# Patient Record
Sex: Male | Born: 1965 | Race: White | Hispanic: No | Marital: Married | Smoking: Never smoker
Health system: Southern US, Community
[De-identification: ages and names within clinical notes are randomized; demographics above are authoritative.]

## PROBLEM LIST (undated history)

## (undated) DIAGNOSIS — I10 Essential (primary) hypertension: Secondary | ICD-10-CM

## (undated) HISTORY — PX: HERNIA REPAIR: SHX51

---

## 2010-06-23 ENCOUNTER — Emergency Department (HOSPITAL_COMMUNITY): Admission: EM | Admit: 2010-06-23 | Discharge: 2010-06-23 | Payer: Self-pay | Admitting: Emergency Medicine

## 2011-07-27 IMAGING — CT CT HEAD W/O CM
1 of 2 series · 16 of 30 positions shown, 20 images · non-contrast
Comparison: None.

CLINICAL DATA: Fall at work.  Trauma to anterior head.  Loss of
consciousness.  Nausea.  Headache.

CT HEAD WITHOUT CONTRAST
TECHNIQUE: Contiguous axial images were obtained from the base of
the skull through the vertex without contrast.

[Series 3: recon 2: brain · axial · 0.47mm/px · z∈[+165,+309]mm · 16 of 64 slices shown, 20 images]
[im 4/64  brain]
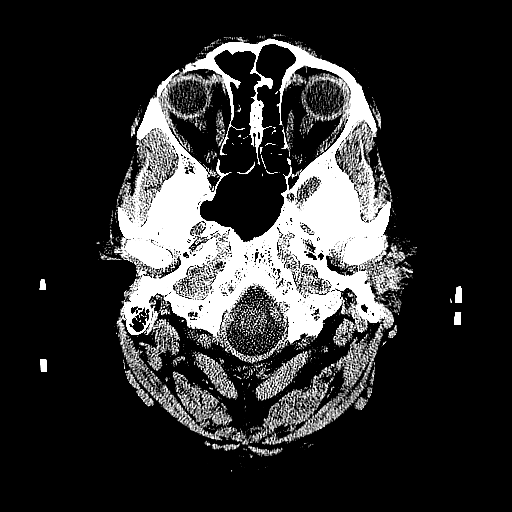
[im 4/64  bone]
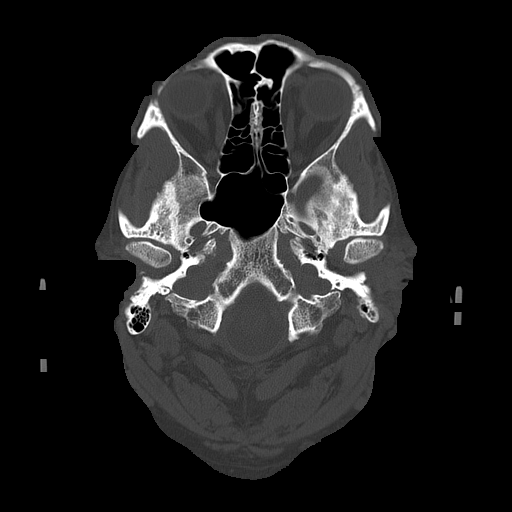
[im 7/64  brain]
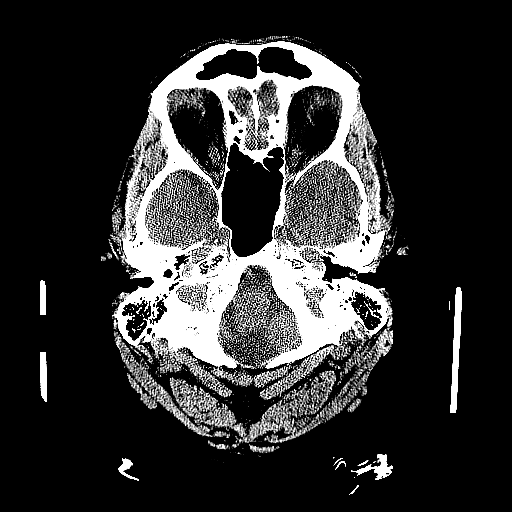
[im 10/64  brain]
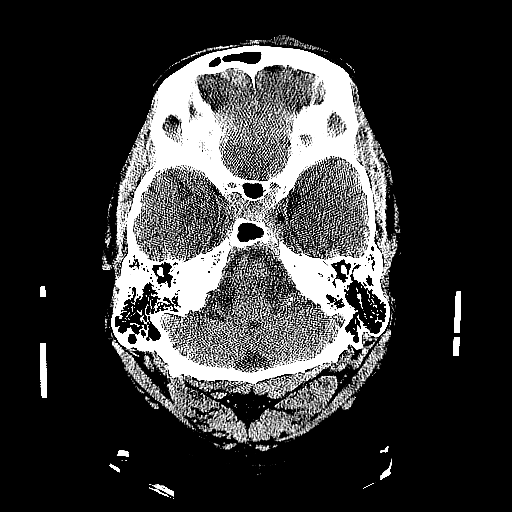
[im 14/64  brain]
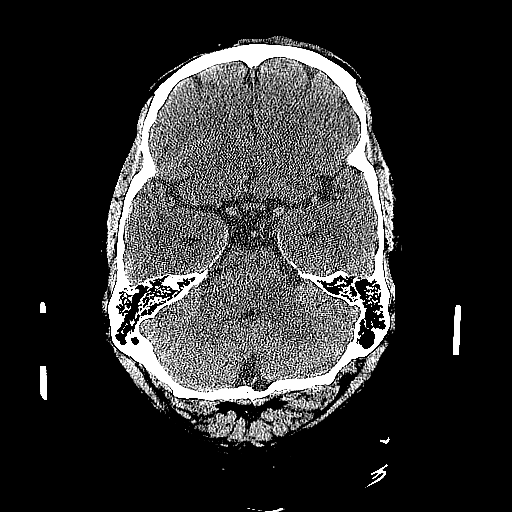
[im 20/64  brain]
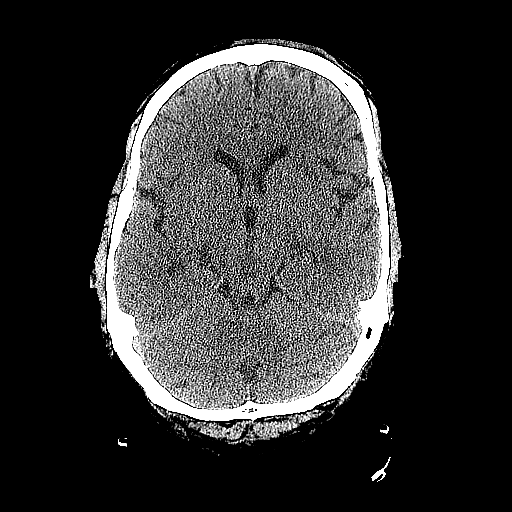
[im 20/64  bone]
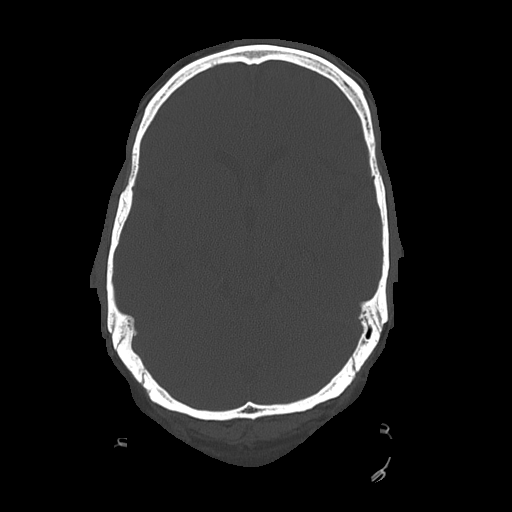
[im 24/64  brain]
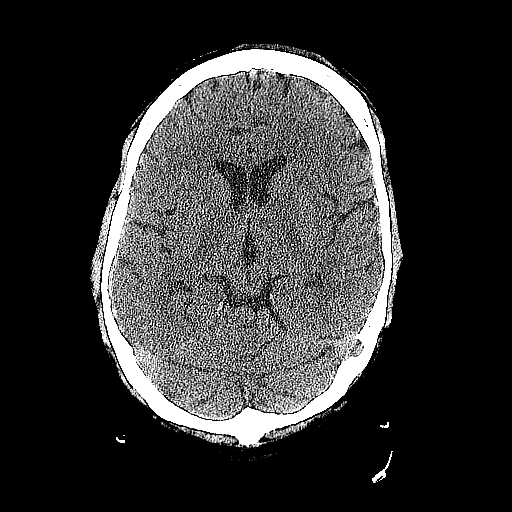
[im 27/64  brain]
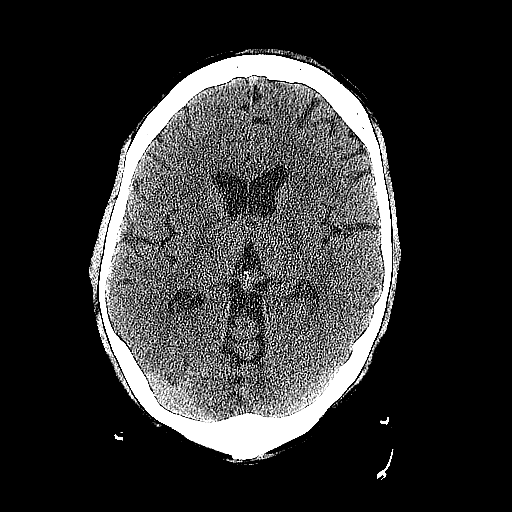
[im 30/64  brain]
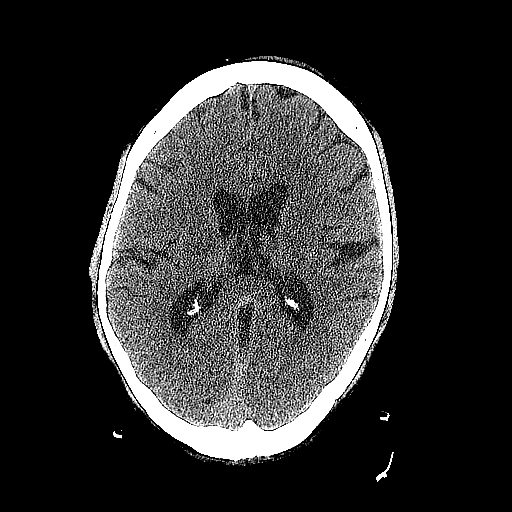
[im 34/64  brain]
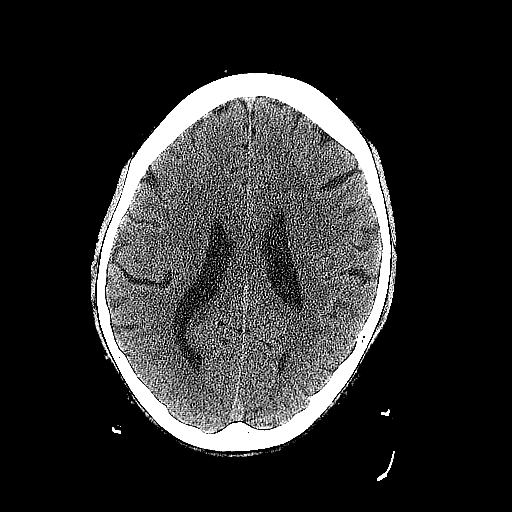
[im 34/64  bone]
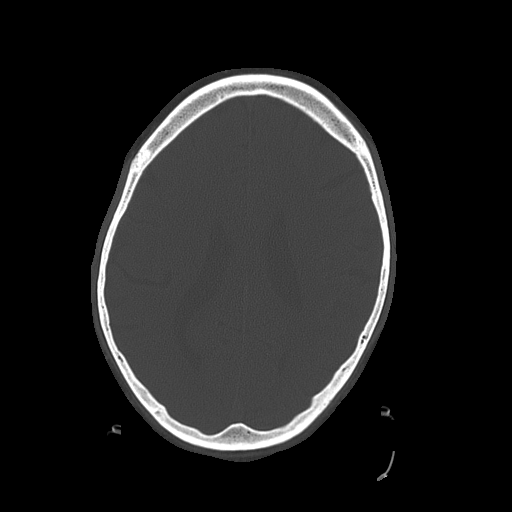
[im 37/64  brain]
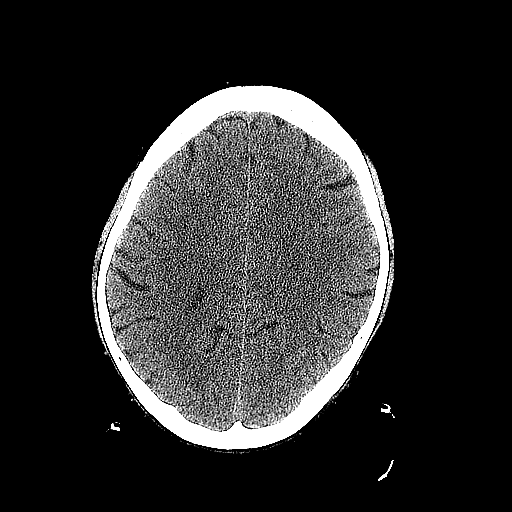
[im 40/64  brain]
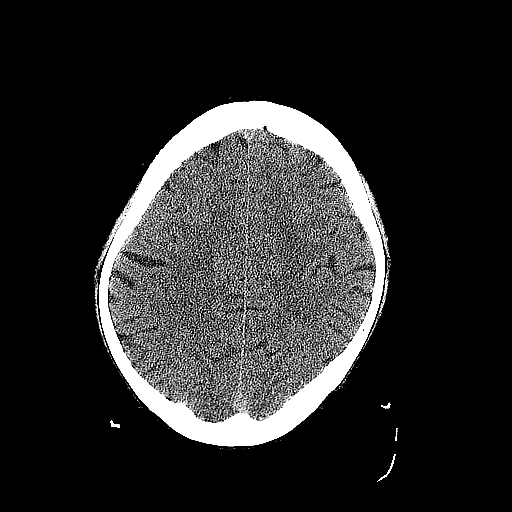
[im 44/64  brain]
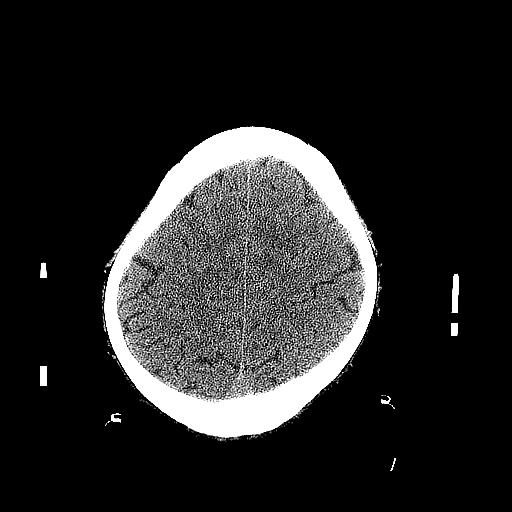
[im 50/64  brain]
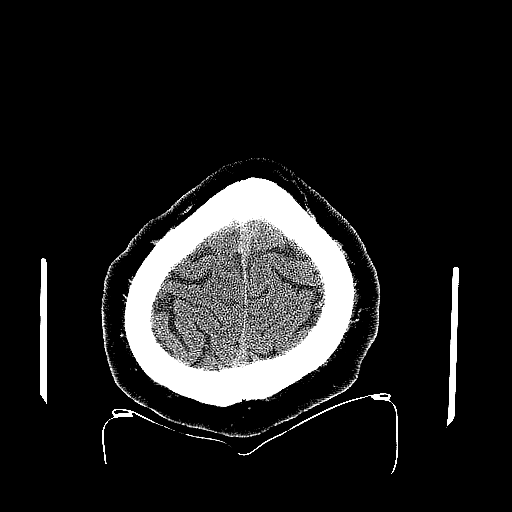
[im 50/64  bone]
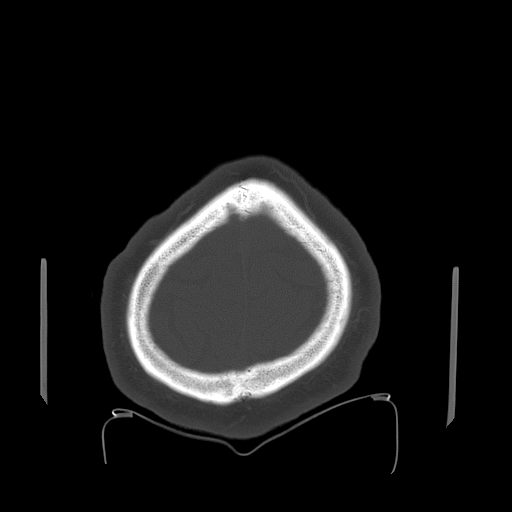
[im 54/64  brain]
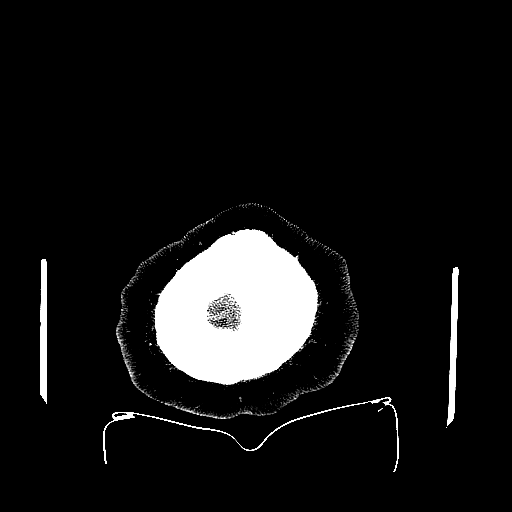
[im 57/64  brain]
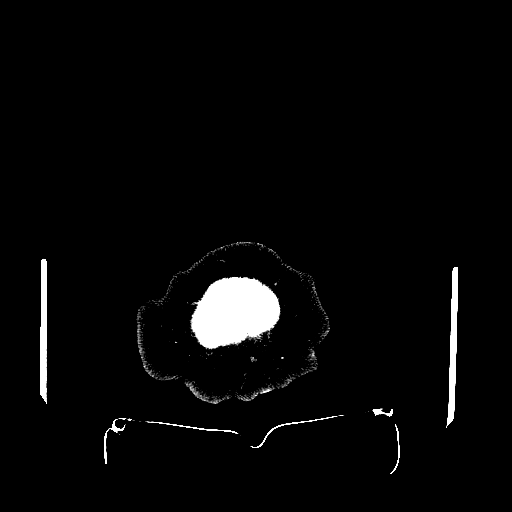
[im 60/64  brain]
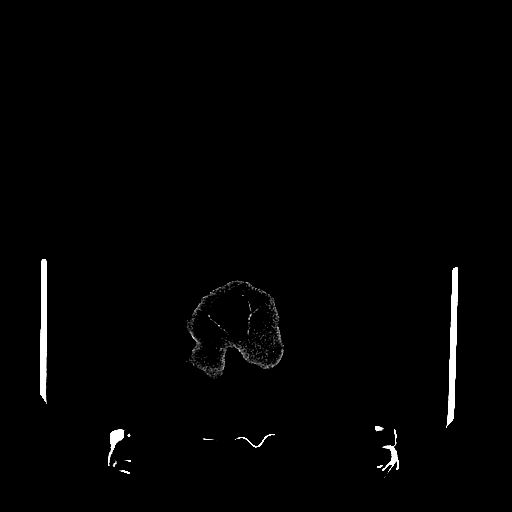

[16 of 30 positions shown; findings below may reference images not displayed]

FINDINGS: Bone windows demonstrate moderate left supraorbital and
frontal scalp soft tissue swelling.  No underlying skull fracture.
Clear paranasal sinuses and mastoid air cells.

Soft tissue windows demonstrate mildly age advanced cerebral
atrophy. No  mass lesion, hemorrhage, hydrocephalus, acute infarct,
intra-axial, or extra-axial fluid collection.
IMPRESSION: 1.  Left supraorbital/frontal scalp soft tissue swelling.
2. No acute intracranial abnormality.
3.  Mildly age advanced cerebral atrophy.

## 2018-08-13 DIAGNOSIS — I10 Essential (primary) hypertension: Secondary | ICD-10-CM | POA: Diagnosis not present

## 2018-08-13 DIAGNOSIS — R51 Headache: Secondary | ICD-10-CM | POA: Diagnosis not present

## 2018-08-13 DIAGNOSIS — J0100 Acute maxillary sinusitis, unspecified: Secondary | ICD-10-CM | POA: Diagnosis not present

## 2018-12-01 DIAGNOSIS — M5430 Sciatica, unspecified side: Secondary | ICD-10-CM | POA: Diagnosis not present

## 2018-12-09 DIAGNOSIS — Z125 Encounter for screening for malignant neoplasm of prostate: Secondary | ICD-10-CM | POA: Diagnosis not present

## 2018-12-09 DIAGNOSIS — J302 Other seasonal allergic rhinitis: Secondary | ICD-10-CM | POA: Diagnosis not present

## 2018-12-09 DIAGNOSIS — M5432 Sciatica, left side: Secondary | ICD-10-CM | POA: Diagnosis not present

## 2018-12-09 DIAGNOSIS — R5383 Other fatigue: Secondary | ICD-10-CM | POA: Diagnosis not present

## 2018-12-09 DIAGNOSIS — I10 Essential (primary) hypertension: Secondary | ICD-10-CM | POA: Diagnosis not present

## 2019-02-24 ENCOUNTER — Emergency Department (HOSPITAL_COMMUNITY): Payer: 59

## 2019-02-24 ENCOUNTER — Emergency Department (HOSPITAL_COMMUNITY)
Admission: EM | Admit: 2019-02-24 | Discharge: 2019-02-24 | Disposition: A | Discharge status: Home / Self Care | Payer: 59 | Attending: Emergency Medicine | Admitting: Emergency Medicine

## 2019-02-24 ENCOUNTER — Encounter (HOSPITAL_COMMUNITY): Payer: Self-pay | Admitting: Pharmacy Technician

## 2019-02-24 ENCOUNTER — Other Ambulatory Visit: Payer: Self-pay

## 2019-02-24 DIAGNOSIS — E669 Obesity, unspecified: Secondary | ICD-10-CM | POA: Diagnosis not present

## 2019-02-24 DIAGNOSIS — R079 Chest pain, unspecified: Secondary | ICD-10-CM

## 2019-02-24 DIAGNOSIS — I10 Essential (primary) hypertension: Secondary | ICD-10-CM

## 2019-02-24 DIAGNOSIS — R0789 Other chest pain: Secondary | ICD-10-CM | POA: Diagnosis present

## 2019-02-24 HISTORY — DX: Essential (primary) hypertension: I10

## 2019-02-24 LAB — BASIC METABOLIC PANEL
Anion gap: 10 (ref 5–15)
BUN: 17 mg/dL (ref 6–20)
CO2: 21 mmol/L — ABNORMAL LOW (ref 22–32)
Calcium: 9.1 mg/dL (ref 8.9–10.3)
Chloride: 106 mmol/L (ref 98–111)
Creatinine, Ser: 0.99 mg/dL (ref 0.61–1.24)
GFR calc Af Amer: >60 mL/min (ref >60)
GFR calc non Af Amer: >60 mL/min (ref >60)
Glucose, Bld: 102 mg/dL — ABNORMAL HIGH (ref 70–99)
Potassium: 4.2 mmol/L (ref 3.5–5.1)
Sodium: 137 mmol/L (ref 135–145)

## 2019-02-24 LAB — CBC
HCT: 45.8 % (ref 39.0–52.0)
Hemoglobin: 15.4 g/dL (ref 13.0–17.0)
MCH: 31.8 pg (ref 26.0–34.0)
MCHC: 33.6 g/dL (ref 30.0–36.0)
MCV: 94.4 fL (ref 80.0–100.0)
Platelets: 243 10*3/uL (ref 150–400)
RBC: 4.85 MIL/uL (ref 4.22–5.81)
RDW: 12.8 % (ref 11.5–15.5)
WBC: 6.3 10*3/uL (ref 4.0–10.5)
nRBC: 0 % (ref 0.0–0.2)

## 2019-02-24 LAB — TROPONIN I (HIGH SENSITIVITY)
Troponin I (High Sensitivity): <2 ng/L (ref <18)
Troponin I (High Sensitivity): 3 ng/L (ref <18)

## 2019-02-24 NOTE — ED Triage Notes (Signed)
Pt arrives via ems with chest tightness onset today. Went to MD office who was concerned with his ekg. Pt given 324mg  asa and 3 SL nitro with complete resolution. Pt with hx of hypertension. VSS with EMS.

## 2019-02-24 NOTE — ED Notes (Signed)
Patient Alert and oriented to baseline. Stable and ambulatory to baseline. Patient verbalized understanding of the discharge instructions.  Patient belongings were taken by the patient.   

## 2019-02-24 NOTE — ED Provider Notes (Signed)
[facility] Provider Note   CSN: [id number] Arrival date & time: 02/24/19  1104   History   Chief Complaint Chief Complaint  Patient presents with  . Chest Pain    HPI Dale Carpenter is a 53 y.o. male who presents with chest pain. PMH significant for asthma, HTN. He states that his BP has been elevated over the past week. It's been running ~160/100s. He saw his PCP and his Lisinopril dose was increased, a diuretic was added, and clonidine was added to take as needed for severely elevated blood pressure.  He states over the past 2 weeks he has had several episodes of chest tightness across his upper chest with associated shortness of breath.  Seem to be worse with exertion and better with rest.  He also had several episodes of lightheadedness.  He went back to his doctor today and they did an EKG and she was concerned with what she saw and therefore decided to have him come to the emergency department.  He was given sublingual nitroglycerin and baby aspirin by EMS and states that his chest tightness is resolved. He denies hx of CAD. He is a former smoker. He has had stress testing ~10 years ago which was normal but no cath.  [facility]  [facility]PI  Past Medical History:  Diagnosis Date  . Hypertension     There are no active problems to display for this patient.   The histories are not reviewed yet. Please review them in the "History" navigator section and refresh this SmartLink.     Home Medications    Prior to Admission medications   Not on File    Family History No family history on file.  Social History Social History   Tobacco Use  . Smoking status: Not on file  Substance Use Topics  . Alcohol use: Not on file  . Drug use: Not on file     Allergies   Patient has no known allergies.   Review of Systems Review of Systems  Constitutional: Negative for diaphoresis and fever.  Respiratory: Positive  for chest tightness and shortness of breath.   Cardiovascular: Negative for chest pain, palpitations and leg swelling.  Gastrointestinal: Negative for abdominal pain, nausea and vomiting.  Neurological: Positive for light-headedness. Negative for syncope.  All other systems reviewed and are negative.    Physical Exam Updated Vital Signs BP 124/86   Pulse 63   Temp 98.5 F (36.9 C) (Oral)   Resp 12   SpO2 98%   Physical Exam Vitals signs and nursing note reviewed.  Constitutional:      General: He is not in acute distress.    Appearance: He is well-developed. He is obese. He is not ill-appearing.  HENT:     Head: Normocephalic and atraumatic.  Eyes:     General: No scleral icterus.       Right eye: No discharge.        Left eye: No discharge.     Conjunctiva/sclera: Conjunctivae normal.     Pupils: Pupils are equal, round, and reactive to light.  Neck:     Musculoskeletal: Normal range of motion.  Cardiovascular:     Rate and Rhythm: Normal rate and regular rhythm.  Pulmonary:     Effort: Pulmonary effort is normal. No respiratory distress.     Breath sounds: Normal breath sounds.  Abdominal:     General: There is no distension.  Palpations: Abdomen is soft.     Tenderness: There is no abdominal tenderness.  Musculoskeletal:     Right lower leg: No edema.     Left lower leg: No edema.  Skin:    General: Skin is warm and dry.  Neurological:     Mental Status: He is alert and oriented to person, place, and time.  Psychiatric:        Behavior: Behavior normal.      ED Treatments / Results  Labs (all labs ordered are listed, but only abnormal results are displayed) Labs Reviewed  BASIC METABOLIC PANEL - Abnormal; Notable for the following components:      Result Value   CO2 21 (*)    Glucose, Bld 102 (*)    All other components within normal limits  CBC  TROPONIN I (HIGH SENSITIVITY)  TROPONIN I (HIGH SENSITIVITY)    EKG EKG Interpretation   Date/Time:  Monday February 24 2019 11:07:44 EDT Ventricular Rate:  82 PR Interval:    QRS Duration: 93 QT Interval:  358 QTC Calculation: 419 R Axis:   24 Text Interpretation:  Sinus rhythm Probable anteroseptal infarct, old No old tracing to compare Confirmed by Dale Carpenter [phone]) on 02/24/2019 12:45:59 PM   Radiology Dg Chest 2 View  Result Date: 02/24/2019 CLINICAL DATA:  Chest tightness, shortness of breath.  Hypertension EXAM: CHEST - 2 VIEW COMPARISON:  03/20/2011 FINDINGS: The heart size and mediastinal contours are within normal limits. Both lungs are clear. The visualized skeletal structures are unremarkable. IMPRESSION: No active cardiopulmonary disease. Electronically Signed   By: Dale Carpenter M.D.   On: 02/24/2019 12:31    Procedures Procedures (including critical care time)  Medications Ordered in ED Medications - No data to display   Initial Impression / Assessment and Plan / ED Course  I have reviewed the triage vital signs and the nursing notes.  Pertinent labs & imaging results that were available during my care of the patient were reviewed by me and considered in my medical decision making (see chart for details).  53 year old male presents with chest tightness which has been ongoing for several weeks along with elevated BP. There are some concerning features in that it's sometimes worse with exertion and it improved with nitro/ASA. Chest pain work up today is reassuring. Doubt ACS, PE, pericarditis, esophageal rupture, tension pneumothorax, aortic dissection, cardiac tamponade. EKG is SR with low voltage and possible T wave inversion in lead III. CXR is negative. Initial and second troponin are <2 and 3 respectively. Labs are unremarkable. No significant past or family hx of cardiac disease. HEART score is 3. He has seen cards in [place] about 10 years ago but would like referral to a cardiologist locally. He likely needs stress test vs elective cath. Advised return  for worsening symptoms.   Final Clinical Impressions(s) / ED Diagnoses   Final diagnoses:  Nonspecific chest pain  Hypertension, unspecified type    ED Discharge Orders    None       Recardo Evangelist, PA-C 02/24/19 [place], [place], DO 02/24/19 1839

## 2019-02-24 NOTE — Discharge Instructions (Signed)
Please continue your blood pressure medicines as prescribed

## 2019-02-24 NOTE — ED Notes (Signed)
Patient transported to X-ray 

## 2019-03-10 ENCOUNTER — Encounter: Payer: Self-pay | Admitting: Cardiology

## 2019-03-10 ENCOUNTER — Telehealth (HOSPITAL_COMMUNITY): Payer: Self-pay | Admitting: *Deleted

## 2019-03-10 ENCOUNTER — Other Ambulatory Visit: Payer: Self-pay

## 2019-03-10 ENCOUNTER — Ambulatory Visit (INDEPENDENT_AMBULATORY_CARE_PROVIDER_SITE_OTHER): Payer: 59 | Admitting: Cardiology

## 2019-03-10 DIAGNOSIS — R0600 Dyspnea, unspecified: Secondary | ICD-10-CM

## 2019-03-10 DIAGNOSIS — R0609 Other forms of dyspnea: Secondary | ICD-10-CM

## 2019-03-10 DIAGNOSIS — E663 Overweight: Secondary | ICD-10-CM

## 2019-03-10 DIAGNOSIS — I10 Essential (primary) hypertension: Secondary | ICD-10-CM

## 2019-03-10 DIAGNOSIS — R079 Chest pain, unspecified: Secondary | ICD-10-CM

## 2019-03-10 DIAGNOSIS — R0789 Other chest pain: Secondary | ICD-10-CM

## 2019-03-10 HISTORY — DX: Essential (primary) hypertension: I10

## 2019-03-10 HISTORY — DX: Other forms of dyspnea: R06.09

## 2019-03-10 HISTORY — DX: Overweight: E66.3

## 2019-03-10 HISTORY — DX: Other chest pain: R07.89

## 2019-03-10 HISTORY — DX: Dyspnea, unspecified: R06.00

## 2019-03-10 NOTE — Patient Instructions (Signed)
Medication Instructions:  Your physician recommends that you continue on your current medications as directed. Please refer to the Current Medication list given to you today.  If you need a refill on your cardiac medications before your next appointment, please call your pharmacy.   Lab work: D-dimer If you have labs (blood work) drawn today and your tests are completely normal, you will receive your results only by: Marland Kitchen. MyChart Message (if you have MyChart) OR . A paper copy in the mail If you have any lab test that is abnormal or we need to change your treatment, we will call you to review the results.  Testing/Procedures: Lexiscan  Follow-Up: At [facility], you and your health needs are our priority.  As part of our continuing mission to provide you with exceptional heart care, we have created designated Provider Care Teams.  These Care Teams include your primary Cardiologist (physician) and Advanced Practice Providers (APPs -  Physician Assistants and Nurse Practitioners) who all work together to provide you with the care you need, when you need it. You will need a follow up appointment in 3 weeks.   Cardiac Nuclear Scan A cardiac nuclear scan is a test that measures blood flow to the heart when a person is resting and when he or she is exercising. The test looks for problems such as:  Not enough blood reaching a portion of the heart.  The heart muscle not working normally. You may need this test if:  You have heart disease.  You have had abnormal lab results.  You have had heart surgery or a balloon procedure to open up blocked arteries (angioplasty).  You have chest pain.  You have shortness of breath. In this test, a radioactive dye (tracer) is injected into your bloodstream. After the tracer has traveled to your heart, an imaging device is used to measure how much of the tracer is absorbed by or distributed to various areas of your heart. This procedure is usually done at  a hospital and takes 2-4 hours. Tell a health care provider about:  Any allergies you have.  All medicines you are taking, including vitamins, herbs, eye drops, creams, and over-the-counter medicines.  Any problems you or family members have had with anesthetic medicines.  Any blood disorders you have.  Any surgeries you have had.  Any medical conditions you have.  Whether you are pregnant or may be pregnant. What are the risks? Generally, this is a safe procedure. However, problems may occur, including:  Serious chest pain and heart attack. This is only a risk if the stress portion of the test is done.  Rapid heartbeat.  Sensation of warmth in your chest. This usually passes quickly.  Allergic reaction to the tracer. What happens before the procedure?  Ask your health care provider about changing or stopping your regular medicines. This is especially important if you are taking diabetes medicines or blood thinners.  Follow instructions from your health care provider about eating or drinking restrictions.  Remove your jewelry on the day of the procedure. What happens during the procedure?  An IV will be inserted into one of your veins.  Your health care provider will inject a small amount of radioactive tracer through the IV.  You will wait for 20-40 minutes while the tracer travels through your bloodstream.  Your heart activity will be monitored with an electrocardiogram (ECG).  You will lie down on an exam table.  Images of your heart will be taken for about  15-20 minutes.  You may also have a stress test. For this test, one of the following may be done: ? You will exercise on a treadmill or stationary bike. While you exercise, your heart's activity will be monitored with an ECG, and your blood pressure will be checked. ? You will be given medicines that will increase blood flow to parts of your heart. This is done if you are unable to exercise.  When blood flow  to your heart has peaked, a tracer will again be injected through the IV.  After 20-40 minutes, you will get back on the exam table and have more images taken of your heart.  Depending on the type of tracer used, scans may need to be repeated 3-4 hours later.  Your IV line will be removed when the procedure is over. The procedure may vary among health care providers and hospitals. What happens after the procedure?  Unless your health care provider tells you otherwise, you may return to your normal schedule, including diet, activities, and medicines.  Unless your health care provider tells you otherwise, you may increase your fluid intake. This will help to flush the contrast dye from your body. Drink enough fluid to keep your urine pale yellow.  Ask your health care provider, or the department that is doing the test: ? When will my results be ready? ? How will I get my results? Summary  A cardiac nuclear scan measures the blood flow to the heart when a person is resting and when he or she is exercising.  Tell your health care provider if you are pregnant.  Before the procedure, ask your health care provider about changing or stopping your regular medicines. This is especially important if you are taking diabetes medicines or blood thinners.  After the procedure, unless your health care provider tells you otherwise, increase your fluid intake. This will help flush the contrast dye from your body.  After the procedure, unless your health care provider tells you otherwise, you may return to your normal schedule, including diet, activities, and medicines. This information is not intended to replace advice given to you by your health care provider. Make sure you discuss any questions you have with your health care provider. Document Released: 08/18/2004 Document Revised: 01/07/2018 Document Reviewed: 01/07/2018 Elsevier Patient Education  [place]. Other Special Instructions Will  Be Listed Below (If Applicable). NONE

## 2019-03-10 NOTE — Addendum Note (Signed)
Addended by: Polly Cobia A on: 03/10/2019 10:28 AM   Modules accepted: Orders

## 2019-03-10 NOTE — Telephone Encounter (Signed)
Patient given detailed instructions per Myocardial Perfusion Study Information Sheet for the test on 03/12/19 at 1:00. Patient notified to arrive 15 minutes early and that it is imperative to arrive on time for appointment to keep from having the test rescheduled.  If you need to cancel or reschedule your appointment, please call the office within 24 hours of your appointment. Patient aware of location change from [facility] to [place]. Patient verbalized understanding.Dale Carpenter

## 2019-03-10 NOTE — Telephone Encounter (Signed)
Called patient to go over instructions and location change to [place].

## 2019-03-10 NOTE — Progress Notes (Signed)
Cardiology Office Note:    Date:  03/10/2019   ID:  Dale Carpenter, DOB 17-Dec-1965, MRN [id number]  PCP:  [facility] [place]  Cardiologist:  Jenean Lindau, MD   Referring MD: [facility]:    1. Chest discomfort   2. Essential hypertension   3. Overweight   4. Dyspnea on exertion    PLAN:    In order of problems listed above:  1. Chest discomfort and dyspnea on exertion: I reviewed emergency room records extensively.  I reviewed EKG also.  This was unremarkable.  In view of the same I will do a d-dimer today to rule out any possibility of pulmonary thromboembolism.  Because of risk factors I will also do a Lexiscan sestamibi.  Patient knows to go to the nearest emergency room for any significant concerns.  He also needs to take enteric-coated baby aspirin on a daily basis for the next few days till his tests are done.  He does have nitroglycerin prescription and knows how to use it. 2. Essential hypertension: Diet was discussed.  Weight reduction was also stressed.  Salt intake issues were discussed diet was discussed for obesity and risks of obesity explained 3. Patient will be seen in follow-up appointment in 3 weeks or earlier if the patient has any concerns    Medication Adjustments/Labs and Tests Ordered: Current medicines are reviewed at length with the patient today.  Concerns regarding medicines are outlined above.  No orders of the defined types were placed in this encounter.  No orders of the defined types were placed in this encounter.    History of Present Illness:    Dale Carpenter is a 53 y.o. male who is being seen today for the evaluation of chest discomfort and dyspnea on exertion at the request of [facility] [place]*.  Patient is a pleasant 53 year old male.  He has past medical history of essential hypertension.  He mentions to me that he has some chest tightness in the upper part of the chest.  No  radiation no orthopnea or PND.  He works in a very hot environment.  He went to the emergency room for the same reason.  At the time of my evaluation, the patient is alert awake oriented and in no distress.  Past Medical History:  Diagnosis Date  . Hypertension      Current Medications: Current Meds  Medication Sig  . cloNIDine (CATAPRES) 0.1 MG tablet Take 0.1 mg by mouth as needed. Pressure over 160/100  . escitalopram (LEXAPRO) 5 MG tablet TK 1 T PO D  . fluticasone (FLONASE) 50 MCG/ACT nasal spray Place 2 sprays into both nostrils daily.  Marland Kitchen lisinopril-hydrochlorothiazide (ZESTORETIC) 20-25 MG tablet Take 1 tablet by mouth daily.  . nitroGLYCERIN (NITROSTAT) 0.4 MG SL tablet DISSOLVE 1 T UNDER TONGUE ONCE UTD FOR CHEST PAIN. MAY REPEAT AFTER 5 MINUTES IF [facility]  . VENTOLIN HFA 108 (90 Base) MCG/ACT inhaler Inhale 2 puffs into the lungs as needed.     Allergies:   Patient has no known allergies.   Social History   Socioeconomic History  . Marital status: Married    Spouse name: Not on file  . Number of children: Not on file  . Years of education: Not on file  . Highest education level: Not on file  Occupational History  . Not on file  Social Needs  . Financial resource strain: Not on  file  . Food insecurity    Worry: Not on file    Inability: Not on file  . Transportation needs    Medical: Not on file    Non-medical: Not on file  Tobacco Use  . Smoking status: Never Smoker  . Smokeless tobacco: Current User    Types: Chew  Substance and Sexual Activity  . Alcohol use: Not on file  . Drug use: Not on file  . Sexual activity: Not on file  Lifestyle  . Physical activity    Days per week: Not on file    Minutes per session: Not on file  . Stress: Not on file  Relationships  . Social Musicianconnections    Talks on phone: Not on file    Gets together: Not on file    Attends religious service: Not on file    Active member of club or  organization: Not on file    Attends meetings of clubs or organizations: Not on file    Relationship status: Not on file  Other Topics Concern  . Not on file  Social History Narrative  . Not on file     Family History: The patient's family history is not on file.  ROS:   Please see the history of present illness.    All other systems reviewed and are negative.  EKGs/Labs/Other Studies Reviewed:    The following studies were reviewed today: EKG reveals sinus rhythm and nonspecific ST-T changes   Recent Labs: 02/24/2019: BUN 17; Creatinine, Ser 0.99; Hemoglobin 15.4; Platelets 243; Potassium 4.2; Sodium 137  Recent Lipid Panel No results found for: CHOL, TRIG, HDL, CHOLHDL, VLDL, LDLCALC, LDLDIRECT  Physical Exam:    VS:  BP 132/86   Pulse 71   Ht 5\' 7"  (1.702 m)   Wt 254 lb (115.2 kg)   SpO2 100%   BMI 39.78 kg/m     Wt Readings from Last 3 Encounters:  03/10/19 254 lb (115.2 kg)     GEN: Patient is in no acute distress HEENT: Normal NECK: No JVD; No carotid bruits LYMPHATICS: No lymphadenopathy CARDIAC: S1 S2 regular, 2/6 systolic murmur at the apex. RESPIRATORY:  Clear to auscultation without rales, wheezing or rhonchi  ABDOMEN: Soft, non-tender, non-distended MUSCULOSKELETAL:  No edema; No deformity  SKIN: Warm and dry NEUROLOGIC:  Alert and oriented x 3 PSYCHIATRIC:  Normal affect    Signed, Garwin Brothersajan R Chelby Salata, MD  03/10/2019 10:16 AM    [facility]

## 2019-03-11 ENCOUNTER — Telehealth (HOSPITAL_COMMUNITY): Payer: Self-pay | Admitting: *Deleted

## 2019-03-11 LAB — D-DIMER, QUANTITATIVE: D-DIMER: 0.2 mg/L FEU (ref 0.00–0.49)

## 2019-03-11 NOTE — Telephone Encounter (Signed)
Close encounter 

## 2019-03-12 ENCOUNTER — Other Ambulatory Visit: Payer: Self-pay

## 2019-03-12 ENCOUNTER — Ambulatory Visit (HOSPITAL_COMMUNITY)
Admission: RE | Admit: 2019-03-12 | Discharge: 2019-03-12 | Disposition: A | Discharge status: Home / Self Care | Payer: 59 | Source: Ambulatory Visit | Attending: Cardiology | Admitting: Cardiology

## 2019-03-12 DIAGNOSIS — R079 Chest pain, unspecified: Secondary | ICD-10-CM | POA: Insufficient documentation

## 2019-03-12 LAB — MYOCARDIAL PERFUSION IMAGING
LV dias vol: 111 mL (ref 62–150)
LV sys vol: 57 mL
Peak HR: 90 {beats}/min
Rest HR: 63 {beats}/min
SDS: 0
SRS: 0
SSS: 0
TID: 1.3

## 2019-03-12 MED ORDER — REGADENOSON 0.4 MG/5ML IV SOLN
0.4000 mg | Freq: Once | INTRAVENOUS | Status: AC
  Administered 2019-03-12: 0.4 mg via INTRAVENOUS

## 2019-03-12 MED ORDER — TECHNETIUM TC 99M TETROFOSMIN IV KIT
10.4000 | PACK | Freq: Once | INTRAVENOUS | Status: AC | PRN
  Administered 2019-03-12: 13:00:00 10.4 via INTRAVENOUS
  Filled 2019-03-12: qty 11

## 2019-03-12 MED ORDER — TECHNETIUM TC 99M TETROFOSMIN IV KIT
32.1000 | PACK | Freq: Once | INTRAVENOUS | Status: AC | PRN
  Administered 2019-03-12: 14:00:00 32.1 via INTRAVENOUS
  Filled 2019-03-12: qty 33

## 2019-03-13 ENCOUNTER — Telehealth: Payer: Self-pay

## 2019-03-13 NOTE — Telephone Encounter (Signed)
Information relayed to patient and his spouse. Patient was taken out of work by Dr. Docia Furl and letter faxed to [facility] office for pickup along with copy of results. Patient scheduled to see Dr. Docia Furl for f/u on 03/20/19. RN verified he has been taking aspirin and has nitroglycerin on hand. Patient informed not to do any strenuous work and to seek immediate medical attention if anything changes.

## 2019-03-13 NOTE — Telephone Encounter (Signed)
Left message for patient to call back today to discuss lexi results. Dr. Docia Furl would like patient to come into office for discussion asap. Tentative appt scheduled for 03/20/19 pending pt response.

## 2019-03-13 NOTE — Telephone Encounter (Signed)
-----   Message from Jenean Lindau, MD sent at 03/13/2019  8:17 AM EDT ----- Needs appointment to see me in the next couple of days to discuss this.  Make sure he takes aspirin every day coated 81 mg and has nitroglycerin.  No stressful work.  We may even give him a letter to be off work. Jenean Lindau, MD 03/13/2019 8:17 AM

## 2019-03-14 ENCOUNTER — Encounter: Payer: Self-pay | Admitting: Cardiology

## 2019-03-14 ENCOUNTER — Other Ambulatory Visit: Payer: Self-pay

## 2019-03-14 ENCOUNTER — Ambulatory Visit (INDEPENDENT_AMBULATORY_CARE_PROVIDER_SITE_OTHER): Payer: 59 | Admitting: Cardiology

## 2019-03-14 VITALS — BP 126/86 | HR 60 | Ht 67.0 in | Wt 252.0 lb

## 2019-03-14 DIAGNOSIS — I10 Essential (primary) hypertension: Secondary | ICD-10-CM

## 2019-03-14 DIAGNOSIS — E663 Overweight: Secondary | ICD-10-CM

## 2019-03-14 DIAGNOSIS — R9439 Abnormal result of other cardiovascular function study: Secondary | ICD-10-CM

## 2019-03-14 DIAGNOSIS — R0609 Other forms of dyspnea: Secondary | ICD-10-CM | POA: Diagnosis not present

## 2019-03-14 DIAGNOSIS — R0789 Other chest pain: Secondary | ICD-10-CM

## 2019-03-14 HISTORY — DX: Abnormal result of other cardiovascular function study: R94.39

## 2019-03-14 NOTE — Progress Notes (Signed)
Cardiology Office Note:    Date:  03/14/2019   ID:  Dale Carpenter, DOB 08/03/1966, MRN [id number]  PCP:  Angelina Sheriff, MD  Cardiologist:  Jenean Lindau, MD   Referring MD: Physicians, [place]:    1. Essential hypertension   2. Abnormal nuclear stress test   3. Overweight   4. Dyspnea on exertion   5. Chest discomfort    PLAN:    In order of problems listed above:  1. Primary prevention stressed with the patient.  Importance of compliance with diet and medication stressed and he vocalized understanding.  He has been advised to take a coated baby aspirin on a daily basis.  Sublingual nitroglycerin prescription was sent, its protocol and 911 protocol explained and the patient vocalized understanding questions were answered to the patient's satisfaction 2. Angina pectoris and abnormal nuclear stress test: His symptoms are very concerning and I had a detailed discussion with him about these.I discussed coronary angiography and left heart catheterization with the patient at extensive length. Procedure, benefits and potential risks were explained. Patient had multiple questions which were answered to the patient's satisfaction. Patient agreed and consented for the procedure. Further recommendations will be made based on the findings of the coronary angiography. In the interim. The patient has any significant symptoms he knows to go to the nearest emergency room. 3. He knows to go to the nearest emergency room for any significant concerns.   Medication Adjustments/Labs and Tests Ordered: Current medicines are reviewed at length with the patient today.  Concerns regarding medicines are outlined above.  No orders of the defined types were placed in this encounter.  No orders of the defined types were placed in this encounter.    No chief complaint on file.    History of Present Illness:    Dale Carpenter is a 53 y.o. male.  Patient was evaluated  by me for shortness of breath on exertion.  He underwent stress testing and this was markedly abnormal.  Details are mentioned above.  Patient is here for follow-up appointment.  Since the above evaluation he is led a sedentary lifestyle because he is concerned about his symptoms.  No orthopnea or PND.  At the time of my evaluation, the patient is alert awake oriented and in no distress.  Past Medical History:  Diagnosis Date  . Hypertension     History reviewed. No pertinent surgical history.  Current Medications: Current Meds  Medication Sig  . cloNIDine (CATAPRES) 0.1 MG tablet Take 0.1 mg by mouth as needed. Pressure over 160/100  . escitalopram (LEXAPRO) 5 MG tablet TK 1 T PO D  . fluticasone (FLONASE) 50 MCG/ACT nasal spray Place 2 sprays into both nostrils daily.  Marland Kitchen lisinopril-hydrochlorothiazide (ZESTORETIC) 20-25 MG tablet Take 1 tablet by mouth daily.  . nitroGLYCERIN (NITROSTAT) 0.4 MG SL tablet DISSOLVE 1 T UNDER TONGUE ONCE UTD FOR CHEST PAIN. MAY REPEAT AFTER 5 MINUTES IF [facility]  . VENTOLIN HFA 108 (90 Base) MCG/ACT inhaler Inhale 2 puffs into the lungs as needed.     Allergies:   Patient has no known allergies.   Social History   Socioeconomic History  . Marital status: Married    Spouse name: Not on file  . Number of children: Not on file  . Years of education: Not on file  . Highest education level: Not on file  Occupational History  . Not on file  Social Needs  . Financial resource strain: Not on file  . Food insecurity    Worry: Not on file    Inability: Not on file  . Transportation needs    Medical: Not on file    Non-medical: Not on file  Tobacco Use  . Smoking status: Never Smoker  . Smokeless tobacco: Current User    Types: Chew  Substance and Sexual Activity  . Alcohol use: Not on file  . Drug use: Not on file  . Sexual activity: Not on file  Lifestyle  . Physical activity    Days per week: Not on file    Minutes  per session: Not on file  . Stress: Not on file  Relationships  . Social connections    Talks on phone: Not on file    Gets together: Not on file    Attends religious service: Not on file    Active member of club or organization: Not on file    Attends meetings of clubs or organizations: Not on file    Relationship status: Not on file  Other Topics Concern  . Not on file  Social History Narrative  . Not on file     Family History: The patient's family history is not on file.  ROS:   Please see the history of present illness.    All other systems reviewed and are negative.  EKGs/Labs/Other Studies Reviewed:    The following studies were reviewed today: Study Highlights    The left ventricular ejection fraction is mildly decreased (45-54%).  Nuclear stress EF: 48%.  There was no ST segment deviation noted during stress.  No prior study for comparison.  Defect 1: There is a large defect of mild severity present in the basal anterior, basal anteroseptal, basal inferoseptal, basal inferior, basal inferolateral, basal anterolateral, mid anterior, mid anteroseptal, mid inferoseptal, mid inferior, mid inferolateral, mid anterolateral, apical anterior, apical septal, apical inferior, apical lateral and apex location.  Findings consistent with ischemia.  This is a high risk study.   The TID ratio is 1.3, and the stress counts appear globally lower than the rest counts. This is concerned for global ischemia vs. Artifact. Recommend anatomic study such as cath or CT coronary for further evaluation. This is a high risk study.       Recent Labs: 02/24/2019: BUN 17; Creatinine, Ser 0.99; Hemoglobin 15.4; Platelets 243; Potassium 4.2; Sodium 137  Recent Lipid Panel No results found for: CHOL, TRIG, HDL, CHOLHDL, VLDL, LDLCALC, LDLDIRECT  Physical Exam:    VS:  BP 126/86   Pulse 60   Ht 5' 7" (1.702 m)   Wt 252 lb (114.3 kg)   SpO2 97%   BMI 39.47 kg/m     Wt Readings  from Last 3 Encounters:  03/14/19 252 lb (114.3 kg)  03/12/19 254 lb (115.2 kg)  03/10/19 254 lb (115.2 kg)     GEN: Patient is in no acute distress HEENT: Normal NECK: No JVD; No carotid bruits LYMPHATICS: No lymphadenopathy CARDIAC: Hear sounds regular, 2/6 systolic murmur at the apex. RESPIRATORY:  Clear to auscultation without rales, wheezing or rhonchi  ABDOMEN: Soft, non-tender, non-distended MUSCULOSKELETAL:  No edema; No deformity  SKIN: Warm and dry NEUROLOGIC:  Alert and oriented x 3 PSYCHIATRIC:  Normal affect   Signed, Rajan R Revankar, MD  03/14/2019 11:32 AM    [facility]  

## 2019-03-14 NOTE — H&P (View-Only) (Signed)
Cardiology Office Note:    Date:  03/14/2019   ID:  Dale Carpenter, DOB 08/03/1966, MRN [id number]  PCP:  Angelina Sheriff, MD  Cardiologist:  Jenean Lindau, MD   Referring MD: Physicians, [place]:    1. Essential hypertension   2. Abnormal nuclear stress test   3. Overweight   4. Dyspnea on exertion   5. Chest discomfort    PLAN:    In order of problems listed above:  1. Primary prevention stressed with the patient.  Importance of compliance with diet and medication stressed and he vocalized understanding.  He has been advised to take a coated baby aspirin on a daily basis.  Sublingual nitroglycerin prescription was sent, its protocol and 911 protocol explained and the patient vocalized understanding questions were answered to the patient's satisfaction 2. Angina pectoris and abnormal nuclear stress test: His symptoms are very concerning and I had a detailed discussion with him about these.I discussed coronary angiography and left heart catheterization with the patient at extensive length. Procedure, benefits and potential risks were explained. Patient had multiple questions which were answered to the patient's satisfaction. Patient agreed and consented for the procedure. Further recommendations will be made based on the findings of the coronary angiography. In the interim. The patient has any significant symptoms he knows to go to the nearest emergency room. 3. He knows to go to the nearest emergency room for any significant concerns.   Medication Adjustments/Labs and Tests Ordered: Current medicines are reviewed at length with the patient today.  Concerns regarding medicines are outlined above.  No orders of the defined types were placed in this encounter.  No orders of the defined types were placed in this encounter.    No chief complaint on file.    History of Present Illness:    Dale Carpenter is a 53 y.o. male.  Patient was evaluated  by me for shortness of breath on exertion.  He underwent stress testing and this was markedly abnormal.  Details are mentioned above.  Patient is here for follow-up appointment.  Since the above evaluation he is led a sedentary lifestyle because he is concerned about his symptoms.  No orthopnea or PND.  At the time of my evaluation, the patient is alert awake oriented and in no distress.  Past Medical History:  Diagnosis Date  . Hypertension     History reviewed. No pertinent surgical history.  Current Medications: Current Meds  Medication Sig  . cloNIDine (CATAPRES) 0.1 MG tablet Take 0.1 mg by mouth as needed. Pressure over 160/100  . escitalopram (LEXAPRO) 5 MG tablet TK 1 T PO D  . fluticasone (FLONASE) 50 MCG/ACT nasal spray Place 2 sprays into both nostrils daily.  Marland Kitchen lisinopril-hydrochlorothiazide (ZESTORETIC) 20-25 MG tablet Take 1 tablet by mouth daily.  . nitroGLYCERIN (NITROSTAT) 0.4 MG SL tablet DISSOLVE 1 T UNDER TONGUE ONCE UTD FOR CHEST PAIN. MAY REPEAT AFTER 5 MINUTES IF [facility]  . VENTOLIN HFA 108 (90 Base) MCG/ACT inhaler Inhale 2 puffs into the lungs as needed.     Allergies:   Patient has no known allergies.   Social History   Socioeconomic History  . Marital status: Married    Spouse name: Not on file  . Number of children: Not on file  . Years of education: Not on file  . Highest education level: Not on file  Occupational History  . Not on file  Social Needs  . Financial resource strain: Not on file  . Food insecurity    Worry: Not on file    Inability: Not on file  . Transportation needs    Medical: Not on file    Non-medical: Not on file  Tobacco Use  . Smoking status: Never Smoker  . Smokeless tobacco: Current User    Types: Chew  Substance and Sexual Activity  . Alcohol use: Not on file  . Drug use: Not on file  . Sexual activity: Not on file  Lifestyle  . Physical activity    Days per week: Not on file    Minutes  per session: Not on file  . Stress: Not on file  Relationships  . Social Musicianconnections    Talks on phone: Not on file    Gets together: Not on file    Attends religious service: Not on file    Active member of club or organization: Not on file    Attends meetings of clubs or organizations: Not on file    Relationship status: Not on file  Other Topics Concern  . Not on file  Social History Narrative  . Not on file     Family History: The patient's family history is not on file.  ROS:   Please see the history of present illness.    All other systems reviewed and are negative.  EKGs/Labs/Other Studies Reviewed:    The following studies were reviewed today: Study Highlights    The left ventricular ejection fraction is mildly decreased (45-54%).  Nuclear stress EF: 48%.  There was no ST segment deviation noted during stress.  No prior study for comparison.  Defect 1: There is a large defect of mild severity present in the basal anterior, basal anteroseptal, basal inferoseptal, basal inferior, basal inferolateral, basal anterolateral, mid anterior, mid anteroseptal, mid inferoseptal, mid inferior, mid inferolateral, mid anterolateral, apical anterior, apical septal, apical inferior, apical lateral and apex location.  Findings consistent with ischemia.  This is a high risk study.   The TID ratio is 1.3, and the stress counts appear globally lower than the rest counts. This is concerned for global ischemia vs. Artifact. Recommend anatomic study such as cath or CT coronary for further evaluation. This is a high risk study.       Recent Labs: 02/24/2019: BUN 17; Creatinine, Ser 0.99; Hemoglobin 15.4; Platelets 243; Potassium 4.2; Sodium 137  Recent Lipid Panel No results found for: CHOL, TRIG, HDL, CHOLHDL, VLDL, LDLCALC, LDLDIRECT  Physical Exam:    VS:  BP 126/86   Pulse 60   Ht 5\' 7"  (1.702 m)   Wt 252 lb (114.3 kg)   SpO2 97%   BMI 39.47 kg/m     Wt Readings  from Last 3 Encounters:  03/14/19 252 lb (114.3 kg)  03/12/19 254 lb (115.2 kg)  03/10/19 254 lb (115.2 kg)     GEN: Patient is in no acute distress HEENT: Normal NECK: No JVD; No carotid bruits LYMPHATICS: No lymphadenopathy CARDIAC: Hear sounds regular, 2/6 systolic murmur at the apex. RESPIRATORY:  Clear to auscultation without rales, wheezing or rhonchi  ABDOMEN: Soft, non-tender, non-distended MUSCULOSKELETAL:  No edema; No deformity  SKIN: Warm and dry NEUROLOGIC:  Alert and oriented x 3 PSYCHIATRIC:  Normal affect   Signed, Garwin Brothersajan R Revankar, MD  03/14/2019 11:32 AM    [facility]

## 2019-03-14 NOTE — Patient Instructions (Addendum)
Medication Instructions:  Your physician recommends that you continue on your current medications as directed. Please refer to the Current Medication list given to you today.  If you need a refill on your cardiac medications before your next appointment, please call your pharmacy.   Lab work: YOU ARE Scheduled for covid screening on 03/17/19 at 2:10 PM at [facility] [address] [place], [place] [address]  If you have labs (blood work) drawn today and your tests are completely normal, you will receive your results only by: Marland Kitchen. MyChart Message (if you have MyChart) OR . A paper copy in the mail If you have any lab test that is abnormal or we need to change your treatment, we will call you to review the results.  Testing/Procedures: You had an EKG performed today.   Your physician has requested that you have a cardiac catheterization. Cardiac catheterization is used to diagnose and/or treat various heart conditions. Doctors may recommend this procedure for a number of different reasons. The most common reason is to evaluate chest pain. Chest pain can be a symptom of coronary artery disease (CAD), and cardiac catheterization can show whether plaque is narrowing or blocking your heart's arteries. This procedure is also used to evaluate the valves, as well as measure the blood flow and oxygen levels in different parts of your heart. For further information please visit [email or web address] Please follow instruction sheet, as given.    [facility] [place] [address], [address] [place] [address] Dept: [phone] Loc: [phone]  Esperanza HeirMichael L Heckstall  03/14/2019  You are scheduled for a Cardiac Catheterization on Friday, August 14 with Dr. Lance MussJayadeep Varanasi.  1. Please [facility] at [facility]: [address] [place], [place] [address] at 5:30 AM (This time  is two hours before your procedure to ensure your preparation). Free valet parking service is available.   Special note: Every effort is made to have your procedure done on time. Please understand that emergencies sometimes delay scheduled procedures.  2. Diet: Do not eat solid foods after midnight.  The patient may have clear liquids until 5am upon the day of the procedure.  3. Labs:NONE 4. Medication instructions in preparation for your procedure:   Contrast Allergy: No  Stop taking, lisinopril-HCTZ Thursday, August 13,   On the morning of your procedure, take your Aspirin and any morning medicines NOT listed above.  You may use sips of water.  5. Plan for one night stay--bring personal belongings. 6. Bring a current list of your medications and current insurance cards. 7. You MUST have a responsible person to drive you home. 8. Someone MUST be with you the first 24 hours after you arrive home or your discharge will be delayed. 9. Please wear clothes that are easy to get on and off and wear slip-on shoes.  Thank you for allowing us to care for you!   -- [facility]   Follow-Up: At [facility], you and your health needs are our priority.  As part of our continuing mission to provide you with exceptional heart care, we have created designated Provider Care Teams.  These Care Teams include your primary Cardiologist (physician) and Advanced Practice Providers (APPs -  Physician Assistants and Nurse Practitioners) who all work together to provide you with the care you need, when you need it. You will need a follow up appointment  in 1 months.    Any Other Special Instructions Will Be Listed Below   Coronary Angiogram A coronary angiogram is an X-ray procedure that is used to examine the arteries in the heart. In this procedure, a dye (contrast dye) is injected through a long, thin tube (catheter). The catheter is inserted through the groin, wrist, or  arm. The dye is injected into each artery, then X-rays are taken to show if there is a blockage in the arteries of the heart. This procedure can also show if you have valve disease or a disease of the aorta, and it can be used to check the overall function of your heart muscle. You may have a coronary angiogram if:  You are having chest pain, or other symptoms of angina, and you are at risk for heart disease.  You have an abnormal electrocardiogram (ECG) or stress test.  You have chest pain and heart failure.  You are having irregular heart rhythms.  You and your health care provider determine that the benefits of the test information outweigh the risks of the procedure. Let your health care provider know about:  Any allergies you have, including allergies to contrast dye.  All medicines you are taking, including vitamins, herbs, eye drops, creams, and over-the-counter medicines.  Any problems you or family members have had with anesthetic medicines.  Any blood disorders you have.  Any surgeries you have had.  History of kidney problems or kidney failure.  Any medical conditions you have.  Whether you are pregnant or may be pregnant. What are the risks? Generally, this is a safe procedure. However, problems may occur, including:  Infection.  Allergic reaction to medicines or dyes that are used.  Bleeding from the access site or other locations.  Kidney injury, especially in people with impaired kidney function.  Stroke (rare).  Heart attack (rare).  Damage to other structures or organs. What happens before the procedure? Staying hydrated Follow instructions from your health care provider about hydration, which may include:  Up to 2 hours before the procedure - you may continue to drink clear liquids, such as water, clear fruit juice, black coffee, and plain tea. Eating and drinking restrictions Follow instructions from your health care provider about eating and  drinking, which may include:  8 hours before the procedure - stop eating heavy meals or foods such as meat, fried foods, or fatty foods.  6 hours before the procedure - stop eating light meals or foods, such as toast or cereal.  2 hours before the procedure - stop drinking clear liquids. General instructions  Ask your health care provider about: ? Changing or stopping your regular medicines. This is especially important if you are taking diabetes medicines or blood thinners. ? Taking medicines such as ibuprofen. These medicines can thin your blood. Do not take these medicines before your procedure if your health care provider instructs you not to, though aspirin may be recommended prior to coronary angiograms.  Plan to have someone take you home from the hospital or clinic.  You may need to have blood tests or X-rays done. What happens during the procedure?  An IV tube will be inserted into one of your veins.  You will be given one or more of the following: ? A medicine to help you relax (sedative). ? A medicine to numb the area where the catheter will be inserted into an artery (local anesthetic).  To reduce your risk of infection: ? Your health care team will wash  or sanitize their hands. ? Your skin will be washed with soap. ? Hair may be removed from the area where the catheter will be inserted.  You will be connected to a continuous ECG monitor.  The catheter will be inserted into an artery. The location may be in your groin, in your wrist, or in the fold of your arm (near your elbow).  A type of X-ray (fluoroscopy) will be used to help guide the catheter to the opening of the blood vessel that is being examined.  A dye will be injected into the catheter, and X-rays will be taken. The dye will help to show where any narrowing or blockages are located in the heart arteries.  Tell your health care provider if you have any chest pain or trouble breathing during the procedure.   If blockages are found, your health care provider may perform another procedure, such as inserting a coronary stent. The procedure may vary among health care providers and hospitals. What happens after the procedure?  After the procedure, you will need to keep the area still for a few hours, or for as long as told by your health care provider. If the procedure is done through the groin, you will be instructed to not bend and not cross your legs.  The insertion site will be checked frequently.  The pulse in your foot or wrist will be checked frequently.  You may have additional blood tests, X-rays, and a test that records the electrical activity of your heart (ECG).  Do not drive for 24 hours if you were given a sedative. Summary  A coronary angiogram is an X-ray procedure that is used to look into the arteries in the heart.  During the procedure, a dye (contrast dye) is injected through a long, thin tube (catheter). The catheter is inserted through the groin, wrist, or arm.  Tell your health care provider about any allergies you have, including allergies to contrast dye.  After the procedure, you will need to keep the area still for a few hours, or for as long as told by your health care provider. This information is not intended to replace advice given to you by your health care provider. Make sure you discuss any questions you have with your health care provider. Document Released: 01/28/2003 Document Revised: 07/06/2017 Document Reviewed: 05/05/2016 Elsevier Patient Education  2020 [employer]

## 2019-03-17 ENCOUNTER — Other Ambulatory Visit (HOSPITAL_COMMUNITY): Payer: 59

## 2019-03-18 ENCOUNTER — Other Ambulatory Visit (HOSPITAL_COMMUNITY)
Admission: RE | Admit: 2019-03-18 | Discharge: 2019-03-18 | Disposition: A | Discharge status: Home / Self Care | Payer: 59 | Source: Ambulatory Visit | Attending: Interventional Cardiology | Admitting: Interventional Cardiology

## 2019-03-18 DIAGNOSIS — Z20828 Contact with and (suspected) exposure to other viral communicable diseases: Secondary | ICD-10-CM | POA: Insufficient documentation

## 2019-03-18 DIAGNOSIS — Z01812 Encounter for preprocedural laboratory examination: Secondary | ICD-10-CM | POA: Diagnosis present

## 2019-03-18 LAB — SARS CORONAVIRUS 2 (TAT 6-24 HRS): SARS Coronavirus 2: NEGATIVE

## 2019-03-20 ENCOUNTER — Ambulatory Visit: Payer: 59 | Admitting: Cardiology

## 2019-03-20 ENCOUNTER — Telehealth: Payer: Self-pay | Admitting: *Deleted

## 2019-03-20 NOTE — Telephone Encounter (Addendum)
Pt contacted pre-catheterization scheduled at [facility] for: Friday March 21, 2019 7:30 AM Verified arrival time and place: [facility]) at: 5:30 AM   No solid food after midnight prior to cath, clear liquids until 5 AM day of procedure. Contrast allergy: no  Hold: Lisinopril-HCT-AM of procedure  Except hold medications AM meds can be  taken pre-cath with sip of water including: ASA 81 mg   Confirmed patient has responsible person to drive home post procedure and observe 24 hours after arriving home:yes  Due to Covid-19 pandemic, only one support person will be allowed with patient. Must be the same support person for that patient's entire stay, will be screened and required to wear a mask.   Patients are required to wear a mask when they enter the hospital.      COVID-19 Pre-Screening Questions:  . In the past 7 to 10 days have you had a cough,  shortness of breath, headache, congestion, fever (100 or greater) body aches, chills, sore throat, or sudden loss of taste or sense of smell? no . Have you been around anyone with known Covid 19? no . Have you been around anyone who is awaiting Covid 19 test results in the past 7 to 10 days? no . Have you been around anyone who has been exposed to Covid 19, or has mentioned symptoms of Covid 19 within the past 7 to 10 days? no   I reviewed procedure/mask/visitor instructions, Covid-19 screening questions with patient, he verbalized understanding, thanked me for call.

## 2019-03-21 ENCOUNTER — Encounter (HOSPITAL_COMMUNITY)
Admission: RE | Disposition: A | Discharge status: Home / Self Care | Payer: 59 | Source: Home / Self Care | Attending: Interventional Cardiology

## 2019-03-21 ENCOUNTER — Ambulatory Visit (HOSPITAL_COMMUNITY)
Admission: RE | Admit: 2019-03-21 | Discharge: 2019-03-21 | Disposition: A | Discharge status: Home / Self Care | Payer: 59 | Attending: Interventional Cardiology | Admitting: Interventional Cardiology

## 2019-03-21 ENCOUNTER — Other Ambulatory Visit: Payer: Self-pay

## 2019-03-21 DIAGNOSIS — I2510 Atherosclerotic heart disease of native coronary artery without angina pectoris: Secondary | ICD-10-CM | POA: Insufficient documentation

## 2019-03-21 DIAGNOSIS — R9439 Abnormal result of other cardiovascular function study: Secondary | ICD-10-CM

## 2019-03-21 DIAGNOSIS — E663 Overweight: Secondary | ICD-10-CM

## 2019-03-21 DIAGNOSIS — Z79899 Other long term (current) drug therapy: Secondary | ICD-10-CM | POA: Insufficient documentation

## 2019-03-21 DIAGNOSIS — I10 Essential (primary) hypertension: Secondary | ICD-10-CM | POA: Diagnosis not present

## 2019-03-21 DIAGNOSIS — R0609 Other forms of dyspnea: Secondary | ICD-10-CM | POA: Diagnosis not present

## 2019-03-21 DIAGNOSIS — R0789 Other chest pain: Secondary | ICD-10-CM

## 2019-03-21 DIAGNOSIS — Z6839 Body mass index (BMI) 39.0-39.9, adult: Secondary | ICD-10-CM | POA: Diagnosis not present

## 2019-03-21 HISTORY — PX: LEFT HEART CATH AND CORONARY ANGIOGRAPHY: CATH118249

## 2019-03-21 SURGERY — LEFT HEART CATH AND CORONARY ANGIOGRAPHY
Anesthesia: LOCAL

## 2019-03-21 MED ORDER — FENTANYL CITRATE (PF) 100 MCG/2ML IJ SOLN
INTRAMUSCULAR | Status: DC | PRN
  Administered 2019-03-21: 25 ug via INTRAVENOUS

## 2019-03-21 MED ORDER — SODIUM CHLORIDE 0.9 % IV SOLN: INTRAVENOUS | Status: AC

## 2019-03-21 MED ORDER — HEPARIN (PORCINE) IN NACL 1000-0.9 UT/500ML-% IV SOLN
INTRAVENOUS | Status: DC | PRN
  Administered 2019-03-21 (×2): 500 mL

## 2019-03-21 MED ORDER — VERAPAMIL HCL 2.5 MG/ML IV SOLN
INTRAVENOUS | Status: DC | PRN
  Administered 2019-03-21: 08:00:00 10 mL via INTRA_ARTERIAL

## 2019-03-21 MED ORDER — ONDANSETRON HCL 4 MG/2ML IJ SOLN: 4.0000 mg | Freq: Four times a day (QID) | INTRAMUSCULAR | Status: DC | PRN

## 2019-03-21 MED ORDER — SODIUM CHLORIDE 0.9% FLUSH: 3.0000 mL | INTRAVENOUS | Status: DC | PRN

## 2019-03-21 MED ORDER — HYDRALAZINE HCL 20 MG/ML IJ SOLN: 10.0000 mg | INTRAMUSCULAR | Status: DC | PRN

## 2019-03-21 MED ORDER — FENTANYL CITRATE (PF) 100 MCG/2ML IJ SOLN
INTRAMUSCULAR | Status: AC
  Filled 2019-03-21: qty 2

## 2019-03-21 MED ORDER — LABETALOL HCL 5 MG/ML IV SOLN
10.0000 mg | INTRAVENOUS | Status: DC | PRN
  Administered 2019-03-21: 10 mg via INTRAVENOUS
  Filled 2019-03-21: qty 4

## 2019-03-21 MED ORDER — SODIUM CHLORIDE 0.9% FLUSH: 3.0000 mL | Freq: Two times a day (BID) | INTRAVENOUS | Status: DC

## 2019-03-21 MED ORDER — MIDAZOLAM HCL 2 MG/2ML IJ SOLN
INTRAMUSCULAR | Status: DC | PRN
  Administered 2019-03-21: 2 mg via INTRAVENOUS

## 2019-03-21 MED ORDER — SODIUM CHLORIDE 0.9 % IV SOLN: 250.0000 mL | INTRAVENOUS | Status: DC | PRN

## 2019-03-21 MED ORDER — IOHEXOL 350 MG/ML SOLN
INTRAVENOUS | Status: DC | PRN
  Administered 2019-03-21: 50 mL via INTRACARDIAC

## 2019-03-21 MED ORDER — SODIUM CHLORIDE 0.9 % WEIGHT BASED INFUSION
3.0000 mL/kg/h | INTRAVENOUS | Status: AC
  Administered 2019-03-21: 07:00:00 3 mL/kg/h via INTRAVENOUS

## 2019-03-21 MED ORDER — HEPARIN (PORCINE) IN NACL 1000-0.9 UT/500ML-% IV SOLN
INTRAVENOUS | Status: AC
  Filled 2019-03-21: qty 1000

## 2019-03-21 MED ORDER — VERAPAMIL HCL 2.5 MG/ML IV SOLN
INTRAVENOUS | Status: AC
  Filled 2019-03-21: qty 2

## 2019-03-21 MED ORDER — SODIUM CHLORIDE 0.9 % WEIGHT BASED INFUSION: 1.0000 mL/kg/h | INTRAVENOUS | Status: DC

## 2019-03-21 MED ORDER — HEPARIN SODIUM (PORCINE) 1000 UNIT/ML IJ SOLN
INTRAMUSCULAR | Status: DC | PRN
  Administered 2019-03-21: 6000 [IU] via INTRAVENOUS

## 2019-03-21 MED ORDER — LIDOCAINE HCL (PF) 1 % IJ SOLN
INTRAMUSCULAR | Status: DC | PRN
  Administered 2019-03-21: 2 mL via INTRADERMAL

## 2019-03-21 MED ORDER — ASPIRIN 81 MG PO CHEW: 81.0000 mg | CHEWABLE_TABLET | ORAL | Status: DC

## 2019-03-21 MED ORDER — ACETAMINOPHEN 325 MG PO TABS: 650.0000 mg | ORAL_TABLET | ORAL | Status: DC | PRN

## 2019-03-21 MED ORDER — LIDOCAINE HCL (PF) 1 % IJ SOLN
INTRAMUSCULAR | Status: AC
  Filled 2019-03-21: qty 30

## 2019-03-21 MED ORDER — MIDAZOLAM HCL 2 MG/2ML IJ SOLN
INTRAMUSCULAR | Status: AC
  Filled 2019-03-21: qty 2

## 2019-03-21 SURGICAL SUPPLY — 9 items

## 2019-03-21 NOTE — Discharge Instructions (Signed)
Radial Site Care ° °This sheet gives you information about how to care for yourself after your procedure. Your health care provider may also give you more specific instructions. If you have problems or questions, contact your health care provider. °What can I expect after the procedure? °After the procedure, it is common to have: °· Bruising and tenderness at the catheter insertion area. °Follow these instructions at home: °Medicines °· Take over-the-counter and prescription medicines only as told by your health care provider. °Insertion site care °· Follow instructions from your health care provider about how to take care of your insertion site. Make sure you: °? Wash your hands with soap and water before you change your bandage (dressing). If soap and water are not available, use hand sanitizer. °? Change your dressing as told by your health care provider. °? Leave stitches (sutures), skin glue, or adhesive strips in place. These skin closures may need to stay in place for 2 weeks or longer. If adhesive strip edges start to loosen and curl up, you may trim the loose edges. Do not remove adhesive strips completely unless your health care provider tells you to do that. °· Check your insertion site every day for signs of infection. Check for: °? Redness, swelling, or pain. °? Fluid or blood. °? Pus or a bad smell. °? Warmth. °· Do not take baths, swim, or use a hot tub until your health care provider approves. °· You may shower 24-48 hours after the procedure, or as directed by your health care provider. °? Remove the dressing and gently wash the site with plain soap and water. °? Pat the area dry with a clean towel. °? Do not rub the site. That could cause bleeding. °· Do not apply powder or lotion to the site. °Activity ° °· For 24 hours after the procedure, or as directed by your health care provider: °? Do not flex or bend the affected arm. °? Do not push or pull heavy objects with the affected arm. °? Do not  drive yourself home from the hospital or clinic. You may drive 24 hours after the procedure unless your health care provider tells you not to. °? Do not operate machinery or power tools. °· Do not lift anything that is heavier than 10 lb (4.5 kg), or the limit that you are told, until your health care provider says that it is safe. °· Ask your health care provider when it is okay to: °? Return to work or school. °? Resume usual physical activities or sports. °? Resume sexual activity. °General instructions °· If the catheter site starts to bleed, raise your arm and put firm pressure on the site. If the bleeding does not stop, get help right away. This is a medical emergency. °· If you went home on the same day as your procedure, a responsible adult should be with you for the first 24 hours after you arrive home. °· Keep all follow-up visits as told by your health care provider. This is important. °Contact a health care provider if: °· You have a fever. °· You have redness, swelling, or yellow drainage around your insertion site. °Get help right away if: °· You have unusual pain at the radial site. °· The catheter insertion area swells very fast. °· The insertion area is bleeding, and the bleeding does not stop when you hold steady pressure on the area. °· Your arm or hand becomes pale, cool, tingly, or numb. °These symptoms may represent a serious problem   that is an emergency. Do not wait to see if the symptoms will go away. Get medical help right away. Call your local emergency services (911 in the [place] Do not drive yourself to the hospital. °Summary °· After the procedure, it is common to have bruising and tenderness at the site. °· Follow instructions from your health care provider about how to take care of your radial site wound. Check the wound every day for signs of infection. °· Do not lift anything that is heavier than 10 lb (4.5 kg), or the limit that you are told, until your health care provider says  that it is safe. °This information is not intended to replace advice given to you by your health care provider. Make sure you discuss any questions you have with your health care provider. °Document Released: 08/26/2010 Document Revised: 08/29/2017 Document Reviewed: 08/29/2017 °Elsevier Patient Education © 2020 [employer] ° °

## 2019-03-21 NOTE — Progress Notes (Signed)
Discharge instructions reviewed with pt and his wife (via telephone) both voice understanding.  

## 2019-03-21 NOTE — Interval H&P Note (Signed)
Cath Lab Visit (complete for each Cath Lab visit)  Clinical Evaluation Leading to the Procedure:   ACS: No.  Non-ACS:    Anginal Classification: CCS III  Anti-ischemic medical therapy: Minimal Therapy (1 class of medications)  Non-Invasive Test Results: High-risk stress test findings: cardiac mortality >3%/year  Prior CABG: No previous CABG      History and Physical Interval Note:  03/21/2019 7:34 AM  Dale Carpenter  has presented today for surgery, with the diagnosis of Chest Pain.  The various methods of treatment have been discussed with the patient and family. After consideration of risks, benefits and other options for treatment, the patient has consented to  Procedure(s): LEFT HEART CATH AND CORONARY ANGIOGRAPHY (N/A) as a surgical intervention.  The patient's history has been reviewed, patient examined, no change in status, stable for surgery.  I have reviewed the patient's chart and labs.  Questions were answered to the patient's satisfaction.     Larae Grooms

## 2019-03-24 ENCOUNTER — Encounter (HOSPITAL_COMMUNITY): Payer: Self-pay | Admitting: Interventional Cardiology

## 2019-03-28 ENCOUNTER — Ambulatory Visit: Payer: 59 | Admitting: Cardiology

## 2019-04-08 ENCOUNTER — Ambulatory Visit: Payer: 59 | Admitting: Cardiology

## 2019-04-09 ENCOUNTER — Ambulatory Visit (INDEPENDENT_AMBULATORY_CARE_PROVIDER_SITE_OTHER): Payer: 59 | Admitting: Cardiology

## 2019-04-09 ENCOUNTER — Other Ambulatory Visit: Payer: Self-pay

## 2019-04-09 ENCOUNTER — Encounter: Payer: Self-pay | Admitting: Cardiology

## 2019-04-09 VITALS — BP 140/94 | HR 95 | Ht 67.0 in | Wt 256.0 lb

## 2019-04-09 DIAGNOSIS — I2510 Atherosclerotic heart disease of native coronary artery without angina pectoris: Secondary | ICD-10-CM

## 2019-04-09 DIAGNOSIS — Z13220 Encounter for screening for lipoid disorders: Secondary | ICD-10-CM

## 2019-04-09 DIAGNOSIS — E663 Overweight: Secondary | ICD-10-CM

## 2019-04-09 DIAGNOSIS — I10 Essential (primary) hypertension: Secondary | ICD-10-CM

## 2019-04-09 HISTORY — DX: Atherosclerotic heart disease of native coronary artery without angina pectoris: I25.10

## 2019-04-09 NOTE — Addendum Note (Signed)
Addended by: Beckey Rutter on: 04/09/2019 11:01 AM   Modules accepted: Orders

## 2019-04-09 NOTE — Patient Instructions (Signed)
Medication Instructions:  Your physician recommends that you continue on your current medications as directed. Please refer to the Current Medication list given to you today.  If you need a refill on your cardiac medications before your next appointment, please call your pharmacy.   Lab work: Your physician recommends that you have a BMP, hepatic and lipid drawn.  If you have labs (blood work) drawn today and your tests are completely normal, you will receive your results only by: Marland Kitchen MyChart Message (if you have MyChart) OR . A paper copy in the mail If you have any lab test that is abnormal or we need to change your treatment, we will call you to review the results.  Testing/Procedures: NONE  Follow-Up: At [facility], you and your health needs are our priority.  As part of our continuing mission to provide you with exceptional heart care, we have created designated Provider Care Teams.  These Care Teams include your primary Cardiologist (physician) and Advanced Practice Providers (APPs -  Physician Assistants and Nurse Practitioners) who all work together to provide you with the care you need, when you need it. You will need a follow up appointment in 6 months.

## 2019-04-09 NOTE — Progress Notes (Signed)
Cardiology Office Note:    Date:  04/09/2019   ID:  Dale Carpenter, DOB [birth year], MRN [id number]  PCP:  Noni Saupeedding, John F. II, MD  Cardiologist:  Garwin Brothersajan R Revankar, MD   Referring MD: Physicians, Cheryln [place] F*    ASSESSMENT:    1. Coronary artery disease involving native coronary artery of native heart without angina pectoris   2. Essential hypertension   3. Overweight    PLAN:    In order of problems listed above:  1. Coronary artery disease: Nonobstructive in nature.  I reassured the patient about my findings.  Secondary prevention stressed with the patient.  Importance of compliance with diet and medication stressed and he vocalized understanding. 2. Essential hypertension: Blood pressure stable 3. Mixed dyslipidemia: We will evaluate his lipids today and liver profile also and start him on low-dose statin therapy based on the results.  Importance of compliance with diet medications and exercise stressed. 4. Patient will be seen in follow-up appointment in 6 months or earlier if the patient has any concerns    Medication Adjustments/Labs and Tests Ordered: Current medicines are reviewed at length with the patient today.  Concerns regarding medicines are outlined above.  No orders of the defined types were placed in this encounter.  No orders of the defined types were placed in this encounter.    Chief Complaint  Patient presents with  . Follow-up     History of Present Illness:    Dale HeirMichael L Kavanagh is a 53 y.o. male.  Patient has known coronary artery disease now.  He underwent coronary angiography for abnormal stress test and has only nonobstructive disease with normal systolic function.  Details are mentioned below.  He is very happy to know about this.  No chest pain orthopnea or PND.  At the time of my evaluation, the patient is alert awake oriented and in no distress.  Past Medical History:  Diagnosis Date  . Hypertension     Past Surgical History:   Procedure Laterality Date  . LEFT HEART CATH AND CORONARY ANGIOGRAPHY N/A 03/21/2019   Procedure: LEFT HEART CATH AND CORONARY ANGIOGRAPHY;  Surgeon: Corky CraftsVaranasi, Jayadeep S, MD;  [facility];  Service: Cardiovascular;  Laterality: N/A;    Current Medications: Current Meds  Medication Sig  . aspirin EC 81 MG tablet Take 81 mg by mouth daily.  . cloNIDine (CATAPRES) 0.1 MG tablet Take 0.1 mg by mouth daily as needed (Pressure over 160/100).   Marland Kitchen. escitalopram (LEXAPRO) 5 MG tablet Take 5 mg by mouth daily.   . fluticasone (FLONASE) 50 MCG/ACT nasal spray Place 2 sprays into both nostrils daily as needed for allergies.   . hydrochlorothiazide (HYDRODIURIL) 25 MG tablet TK 1 T PO QAM  . lisinopril (ZESTRIL) 40 MG tablet TK 1 T PO D  . nitroGLYCERIN (NITROSTAT) 0.4 MG SL tablet Place 0.4 mg under the tongue every 5 (five) minutes as needed for chest pain.   . VENTOLIN HFA 108 (90 Base) MCG/ACT inhaler Inhale 2 puffs into the lungs every 6 (six) hours as needed for wheezing or shortness of breath.      Allergies:   Patient has no known allergies.   Social History   Socioeconomic History  . Marital status: Married    Spouse name: Not on file  . Number of children: Not on file  . Years of education: Not on file  . Highest education level: Not on file  Occupational History  . Not on  file  Social Needs  . Financial resource strain: Not on file  . Food insecurity    Worry: Not on file    Inability: Not on file  . Transportation needs    Medical: Not on file    Non-medical: Not on file  Tobacco Use  . Smoking status: Never Smoker  . Smokeless tobacco: Current User    Types: Chew  Substance and Sexual Activity  . Alcohol use: Not on file  . Drug use: Not on file  . Sexual activity: Not on file  Lifestyle  . Physical activity    Days per week: Not on file    Minutes per session: Not on file  . Stress: Not on file  Relationships  . Social Herbalist on  phone: Not on file    Gets together: Not on file    Attends religious service: Not on file    Active member of club or organization: Not on file    Attends meetings of clubs or organizations: Not on file    Relationship status: Not on file  Other Topics Concern  . Not on file  Social History Narrative  . Not on file     Family History: The patient's family history is not on file.  ROS:   Please see the history of present illness.    All other systems reviewed and are negative.  EKGs/Labs/Other Studies Reviewed:    The following studies were reviewed today: Conclusion    Prox RCA lesion is 25% stenosed.  Mid LAD lesion is 10% stenosed.  The left ventricular systolic function is normal.  LV end diastolic pressure is mildly elevated. LVEDP 17 mm Hg  The left ventricular ejection fraction is 50-55% by visual estimate.  There is no aortic valve stenosis.   No significant CAD.  Continue preventive therapy.      Recent Labs: 02/24/2019: BUN 17; Creatinine, Ser 0.99; Hemoglobin 15.4; Platelets 243; Potassium 4.2; Sodium 137  Recent Lipid Panel No results found for: CHOL, TRIG, HDL, CHOLHDL, VLDL, LDLCALC, LDLDIRECT  Physical Exam:    VS:  BP (!) 140/94 (BP Location: Left Arm, Patient Position: Sitting, Cuff Size: Normal)   Pulse 95   Ht 5\' 7"  (1.702 m)   Wt 256 lb (116.1 kg)   SpO2 99%   BMI 40.10 kg/m     Wt Readings from Last 3 Encounters:  04/09/19 256 lb (116.1 kg)  03/21/19 250 lb (113.4 kg)  03/14/19 252 lb (114.3 kg)     GEN: Patient is in no acute distress HEENT: Normal NECK: No JVD; No carotid bruits LYMPHATICS: No lymphadenopathy CARDIAC: Hear sounds regular, 2/6 systolic murmur at the apex. RESPIRATORY:  Clear to auscultation without rales, wheezing or rhonchi  ABDOMEN: Soft, non-tender, non-distended MUSCULOSKELETAL:  No edema; No deformity  SKIN: Warm and dry NEUROLOGIC:  Alert and oriented x 3 PSYCHIATRIC:  Normal affect   Signed,  Jenean Lindau, MD  04/09/2019 10:18 AM    [place]

## 2019-04-10 ENCOUNTER — Telehealth: Payer: Self-pay | Admitting: *Deleted

## 2019-04-10 LAB — BASIC METABOLIC PANEL
BUN/Creatinine Ratio: 16 (ref 9–20)
BUN: 17 mg/dL (ref 6–24)
CO2: 23 mmol/L (ref 20–29)
Calcium: 9.6 mg/dL (ref 8.7–10.2)
Chloride: 99 mmol/L (ref 96–106)
Creatinine, Ser: 1.07 mg/dL (ref 0.76–1.27)
GFR calc Af Amer: 91 mL/min/{1.73_m2} (ref >59)
GFR calc non Af Amer: 79 mL/min/{1.73_m2} (ref >59)
Glucose: 101 mg/dL — ABNORMAL HIGH (ref 65–99)
Potassium: 4.4 mmol/L (ref 3.5–5.2)
Sodium: 138 mmol/L (ref 134–144)

## 2019-04-10 LAB — HEPATIC FUNCTION PANEL
ALT: 21 IU/L (ref 0–44)
AST: 17 IU/L (ref 0–40)
Albumin: 4.5 g/dL (ref 3.8–4.9)
Alkaline Phosphatase: 59 IU/L (ref 39–117)
Bilirubin Total: 0.3 mg/dL (ref 0.0–1.2)
Bilirubin, Direct: 0.1 mg/dL (ref 0.00–0.40)
Total Protein: 6.8 g/dL (ref 6.0–8.5)

## 2019-04-10 LAB — LIPID PANEL
Chol/HDL Ratio: 4 ratio (ref 0.0–5.0)
Cholesterol, Total: 185 mg/dL (ref 100–199)
HDL: 46 mg/dL (ref >39)
LDL Chol Calc (NIH): 120 mg/dL — ABNORMAL HIGH (ref 0–99)
Triglycerides: 107 mg/dL (ref 0–149)
VLDL Cholesterol Cal: 19 mg/dL (ref 5–40)

## 2019-04-10 MED ORDER — ATORVASTATIN CALCIUM 10 MG PO TABS: 10.0000 mg | ORAL_TABLET | Freq: Every day | ORAL | 1 refills | Status: DC

## 2019-04-10 NOTE — Telephone Encounter (Signed)
Telephone call to patient. Left message per DPR regarding lab results and need to start Lipitor 10 mg daily and recheck lipids and liver tests in 6 weeks.

## 2019-04-10 NOTE — Telephone Encounter (Signed)
-----   Message from Jenean Lindau, MD sent at 04/10/2019  8:04 AM EDT ----- Diet, start atorvastatin 10 mg daily liver lipid check in 6 weeks. Jenean Lindau, MD 04/10/2019 8:04 AM

## 2019-04-15 ENCOUNTER — Ambulatory Visit: Payer: 59 | Admitting: Cardiology

## 2019-04-28 ENCOUNTER — Ambulatory Visit: Payer: 59 | Admitting: Cardiology

## 2019-10-30 ENCOUNTER — Ambulatory Visit: Payer: 59 | Admitting: Cardiology

## 2019-10-30 ENCOUNTER — Encounter: Payer: Self-pay | Admitting: Cardiology

## 2019-10-30 ENCOUNTER — Other Ambulatory Visit: Payer: Self-pay

## 2019-10-30 VITALS — BP 136/88 | HR 92 | Ht 67.0 in | Wt 263.0 lb

## 2019-10-30 DIAGNOSIS — Z79899 Other long term (current) drug therapy: Secondary | ICD-10-CM

## 2019-10-30 DIAGNOSIS — I10 Essential (primary) hypertension: Secondary | ICD-10-CM | POA: Diagnosis not present

## 2019-10-30 DIAGNOSIS — I2510 Atherosclerotic heart disease of native coronary artery without angina pectoris: Secondary | ICD-10-CM

## 2019-10-30 HISTORY — DX: Morbid (severe) obesity due to excess calories: E66.01

## 2019-10-30 MED ORDER — CHLORTHALIDONE 25 MG PO TABS: 25.0000 mg | ORAL_TABLET | Freq: Every day | ORAL | 3 refills | Status: DC

## 2019-10-30 NOTE — Patient Instructions (Signed)
Medication Instructions:  Your physician has recommended you make the following change in your medication:   Stop hydrochlorothiazide Start chlothaidone 25 mg daily   *If you need a refill on your cardiac medications before your next appointment, please call your pharmacy*   Lab Work: Your physician recommends that you return for lab work in: next week. You need to have labs done when you are fasting.  You can come Monday through Friday 8:30 am to 12:00 pm and 1:15 to 4:30. You do not need to make an appointment as the order has already been placed. The labs you are going to have done are BMET, CBC, TSH, LFT and Lipids.  If you have labs (blood work) drawn today and your tests are completely normal, you will receive your results only by: Marland Kitchen MyChart Message (if you have MyChart) OR . A paper copy in the mail If you have any lab test that is abnormal or we need to change your treatment, we will call you to review the results.   Testing/Procedures: None ordered   Follow-Up: At [facility], you and your health needs are our priority.  As part of our continuing mission to provide you with exceptional heart care, we have created designated Provider Care Teams.  These Care Teams include your primary Cardiologist (physician) and Advanced Practice Providers (APPs -  Physician Assistants and Nurse Practitioners) who all work together to provide you with the care you need, when you need it.  We recommend signing up for the patient portal called "MyChart".  Sign up information is provided on this After Visit Summary.  MyChart is used to connect with patients for Virtual Visits (Telemedicine).  Patients are able to view lab/test results, encounter notes, upcoming appointments, etc.  Non-urgent messages can be sent to your provider as well.   To learn more about what you can do with MyChart, go to [email or web address].    Your next appointment:   1 month(s)  The format for your next  appointment:   In Person  Provider:   Jyl Heinz, MD   Other Instructions  Blood Pressure Record Sheet To take your blood pressure, you will need a blood pressure machine. You can buy a blood pressure machine (blood pressure monitor) at your clinic, drug store, or online. When choosing one, consider:  An automatic monitor that has an arm cuff.  A cuff that wraps snugly around your upper arm. You should be able to fit only one finger between your arm and the cuff.  A device that stores blood pressure reading results.  Do not choose a monitor that measures your blood pressure from your wrist or finger. Follow your health care provider's instructions for how to take your blood pressure. To use this form:  Get one reading in the morning (a.m.) before you take any medicines.  Get one reading in the evening (p.m.) before supper.  Take at least 2 readings with each blood pressure check. This makes sure the results are correct. Wait 1-2 minutes between measurements.  Write down the results in the spaces on this form.  Repeat this once a week, or as told by your health care provider.  Make a follow-up appointment with your health care provider to discuss the results. Blood pressure log Date: _______________________  a.m. _____________________(1st reading) _____________________(2nd reading)  p.m. _____________________(1st reading) _____________________(2nd reading) Date: _______________________  a.m. _____________________(1st reading) _____________________(2nd reading)  p.m. _____________________(1st reading) _____________________(2nd reading) Date: _______________________  a.m. _____________________(1st reading) _____________________(2nd reading)  p.m. _____________________(1st  reading) _____________________(2nd reading) Date: _______________________  a.m. _____________________(1st reading) _____________________(2nd reading)  p.m. _____________________(1st reading)  _____________________(2nd reading) Date: _______________________  a.m. _____________________(1st reading) _____________________(2nd reading)  p.m. _____________________(1st reading) _____________________(2nd reading) This information is not intended to replace advice given to you by your health care provider. Make sure you discuss any questions you have with your health care provider. Document Revised: 09/21/2017 Document Reviewed: 07/24/2017 Elsevier Patient Education  2020 [employer].

## 2019-10-30 NOTE — Progress Notes (Signed)
Cardiology Office Note:    Date:  10/30/2019   ID:  [name], DOB 02-Mar-1966, MRN [id number]  PCP:  Angelina Sheriff, MD  Cardiologist:  Jenean Lindau, MD   Referring MD: Angelina Sheriff, MD    ASSESSMENT:    1. Coronary artery disease involving native coronary artery of native heart without angina pectoris   2. Essential hypertension   3. Morbid obesity ([place])    PLAN:    In order of problems listed above:  1. Secondary prevention stressed with the patient.  Importance of compliance with diet and medication stressed and he vocalized understanding. 2. Essential hypertension: Diet was discussed.  Salt intake issues were discussed.  I will switch his medication from hydrochlorothiazide to chlorthalidone 25 mg daily he will be back in the next few days with blood pressure readings from home and will also get fasting blood work on that day including Chem-7 liver lipid check and TSH.  He is agreeable.  Based on these readings we will decide about him getting back to work. 3. Mixed dyslipidemia obesity: Diet was discussed at length weight reduction was stressed he promises to do better.  Next week we will get blood work and advise him accordingly.  Follow-up appointment in 2 to 3 weeks or earlier if he has any concerns.   Medication Adjustments/Labs and Tests Ordered: Current medicines are reviewed at length with the patient today.  Concerns regarding medicines are outlined above.  No orders of the defined types were placed in this encounter.  No orders of the defined types were placed in this encounter.    Chief Complaint  Patient presents with  . Hypertension     History of Present Illness:    Dale Carpenter is a 54 y.o. male.  Patient has past medical history of nonobstructive coronary artery disease essential hypertension and dyslipidemia.  He denies any problems at this time and takes care of activities of daily living.  No chest pain orthopnea or  PND.  He is concerned about his elevated blood pressure and mentions to me that his blood pressure goes to about 170s at work.  Today is much better.  At the time of my evaluation, the patient is alert awake oriented and in no distress.  Past Medical History:  Diagnosis Date  . Hypertension     Past Surgical History:  Procedure Laterality Date  . LEFT HEART CATH AND CORONARY ANGIOGRAPHY N/A 03/21/2019   Procedure: LEFT HEART CATH AND CORONARY ANGIOGRAPHY;  Surgeon: Jettie Booze, MD;  Location: [facility];  Service: Cardiovascular;  Laterality: N/A;    Current Medications: Current Meds  Medication Sig  . amLODipine (NORVASC) 10 MG tablet Take 10 mg by mouth daily.  Marland Kitchen aspirin EC 81 MG tablet Take 81 mg by mouth daily.  Marland Kitchen atorvastatin (LIPITOR) 10 MG tablet Take 1 tablet (10 mg total) by mouth daily.  . cloNIDine (CATAPRES) 0.1 MG tablet Take 0.1 mg by mouth daily as needed (Pressure over 160/100).   Marland Kitchen escitalopram (LEXAPRO) 5 MG tablet Take 5 mg by mouth daily.   . fluticasone (FLONASE) 50 MCG/ACT nasal spray Place 2 sprays into both nostrils daily as needed for allergies.   . hydrochlorothiazide (HYDRODIURIL) 25 MG tablet TK 1 T PO QAM  . lisinopril (ZESTRIL) 40 MG tablet TK 1 T PO D  . nitroGLYCERIN (NITROSTAT) 0.4 MG SL tablet Place 0.4 mg under the tongue every 5 (five) minutes as needed  for chest pain.   . rosuvastatin (CRESTOR) 10 MG tablet Take 10 mg by mouth at bedtime.  . VENTOLIN HFA 108 (90 Base) MCG/ACT inhaler Inhale 2 puffs into the lungs every 6 (six) hours as needed for wheezing or shortness of breath.      Allergies:   Patient has no known allergies.   Social History   Socioeconomic History  . Marital status: Married    Spouse name: Not on file  . Number of children: Not on file  . Years of education: Not on file  . Highest education level: Not on file  Occupational History  . Not on file  Tobacco Use  . Smoking status: Never Smoker  .  Smokeless tobacco: Current User    Types: Chew  Substance and Sexual Activity  . Alcohol use: Not on file  . Drug use: Not on file  . Sexual activity: Not on file  Other Topics Concern  . Not on file  Social History Narrative  . Not on file   Social Determinants of Health   Financial Resource Strain:   . Difficulty of Paying Living Expenses:   Food Insecurity:   . Worried About Programme researcher, broadcasting/film/video in the Last Year:   . Barista in the Last Year:   Transportation Needs:   . Freight forwarder (Medical):   Marland Kitchen Lack of Transportation (Non-Medical):   Physical Activity:   . Days of Exercise per Week:   . Minutes of Exercise per Session:   Stress:   . Feeling of Stress :   Social Connections:   . Frequency of Communication with Friends and Family:   . Frequency of Social Gatherings with Friends and Family:   . Attends Religious Services:   . Active Member of Clubs or Organizations:   . Attends Banker Meetings:   Marland Kitchen Marital Status:      Family History: The patient's family history is not on file.  ROS:   Please see the history of present illness.    All other systems reviewed and are negative.  EKGs/Labs/Other Studies Reviewed:    The following studies were reviewed today: LEFT HEART CATH AND CORONARY ANGIOGRAPHY  Conclusion    Prox RCA lesion is 25% stenosed.  Mid LAD lesion is 10% stenosed.  The left ventricular systolic function is normal.  LV end diastolic pressure is mildly elevated. LVEDP 17 mm Hg  The left ventricular ejection fraction is 50-55% by visual estimate.  There is no aortic valve stenosis.   No significant CAD.  Continue preventive therapy.       Recent Labs: 02/24/2019: Hemoglobin 15.4; Platelets 243 04/09/2019: ALT 21; BUN 17; Creatinine, Ser 1.07; Potassium 4.4; Sodium 138  Recent Lipid Panel    Component Value Date/Time   CHOL 185 04/09/2019 1031   TRIG 107 04/09/2019 1031   HDL 46 04/09/2019 1031    CHOLHDL 4.0 04/09/2019 1031   LDLCALC 120 (H) 04/09/2019 1031    Physical Exam:    VS:  BP 136/88   Pulse 92   Ht 5\' 7"  (1.702 m)   Wt 263 lb (119.3 kg)   SpO2 97%   BMI 41.19 kg/m     Wt Readings from Last 3 Encounters:  10/30/19 263 lb (119.3 kg)  04/09/19 256 lb (116.1 kg)  03/21/19 250 lb (113.4 kg)     GEN: Patient is in no acute distress HEENT: Normal NECK: No JVD; No carotid bruits LYMPHATICS: No lymphadenopathy  CARDIAC: Hear sounds regular, 2/6 systolic murmur at the apex. RESPIRATORY:  Clear to auscultation without rales, wheezing or rhonchi  ABDOMEN: Soft, non-tender, non-distended MUSCULOSKELETAL:  No edema; No deformity  SKIN: Warm and dry NEUROLOGIC:  Alert and oriented x 3 PSYCHIATRIC:  Normal affect   Signed, Garwin Brothers, MD  10/30/2019 11:20 AM    [facility]

## 2019-11-07 LAB — CBC WITH DIFFERENTIAL/PLATELET
Basophils Absolute: 0 10*3/uL (ref 0.0–0.2)
Basos: 0 %
EOS (ABSOLUTE): 0.1 10*3/uL (ref 0.0–0.4)
Eos: 1 %
Hematocrit: 48 % (ref 37.5–51.0)
Hemoglobin: 16.3 g/dL (ref 13.0–17.7)
Immature Grans (Abs): 0 10*3/uL (ref 0.0–0.1)
Immature Granulocytes: 0 %
Lymphocytes Absolute: 2.4 10*3/uL (ref 0.7–3.1)
Lymphs: 38 %
MCH: 31.5 pg (ref 26.6–33.0)
MCHC: 34 g/dL (ref 31.5–35.7)
MCV: 93 fL (ref 79–97)
Monocytes Absolute: 0.6 10*3/uL (ref 0.1–0.9)
Monocytes: 9 %
Neutrophils Absolute: 3.2 10*3/uL (ref 1.4–7.0)
Neutrophils: 52 %
Platelets: 280 10*3/uL (ref 150–450)
RBC: 5.17 x10E6/uL (ref 4.14–5.80)
RDW: 12.3 % (ref 11.6–15.4)
WBC: 6.3 10*3/uL (ref 3.4–10.8)

## 2019-11-07 LAB — BASIC METABOLIC PANEL
BUN/Creatinine Ratio: 27 — ABNORMAL HIGH (ref 9–20)
BUN: 25 mg/dL — ABNORMAL HIGH (ref 6–24)
CO2: 23 mmol/L (ref 20–29)
Calcium: 9.5 mg/dL (ref 8.7–10.2)
Chloride: 96 mmol/L (ref 96–106)
Creatinine, Ser: 0.94 mg/dL (ref 0.76–1.27)
GFR calc Af Amer: 106 mL/min/{1.73_m2} (ref >59)
GFR calc non Af Amer: 92 mL/min/{1.73_m2} (ref >59)
Glucose: 96 mg/dL (ref 65–99)
Potassium: 4.4 mmol/L (ref 3.5–5.2)
Sodium: 135 mmol/L (ref 134–144)

## 2019-11-07 LAB — LIPID PANEL
Chol/HDL Ratio: 4.8 ratio (ref 0.0–5.0)
Cholesterol, Total: 207 mg/dL — ABNORMAL HIGH (ref 100–199)
HDL: 43 mg/dL (ref >39)
LDL Chol Calc (NIH): 141 mg/dL — ABNORMAL HIGH (ref 0–99)
Triglycerides: 130 mg/dL (ref 0–149)
VLDL Cholesterol Cal: 23 mg/dL (ref 5–40)

## 2019-11-07 LAB — HEPATIC FUNCTION PANEL
ALT: 17 IU/L (ref 0–44)
AST: 15 IU/L (ref 0–40)
Albumin: 4.5 g/dL (ref 3.8–4.9)
Alkaline Phosphatase: 62 IU/L (ref 39–117)
Bilirubin Total: 0.4 mg/dL (ref 0.0–1.2)
Bilirubin, Direct: 0.11 mg/dL (ref 0.00–0.40)
Total Protein: 7 g/dL (ref 6.0–8.5)

## 2019-11-07 LAB — TSH: TSH: 2.86 u[IU]/mL (ref 0.450–4.500)

## 2019-11-07 MED ORDER — ROSUVASTATIN CALCIUM 20 MG PO TABS: 20.0000 mg | ORAL_TABLET | Freq: Every day | ORAL | 3 refills | Status: DC

## 2019-11-07 NOTE — Addendum Note (Signed)
Addended by: Eleonore Chiquito on: 11/07/2019 01:37 PM   Modules accepted: Orders

## 2019-11-19 ENCOUNTER — Ambulatory Visit: Payer: 59 | Admitting: Cardiology

## 2019-12-01 ENCOUNTER — Other Ambulatory Visit: Payer: Self-pay

## 2019-12-01 ENCOUNTER — Encounter: Payer: Self-pay | Admitting: Cardiology

## 2019-12-01 ENCOUNTER — Ambulatory Visit: Payer: 59 | Admitting: Cardiology

## 2019-12-01 VITALS — BP 118/78 | HR 78 | Temp 97.9°F | Ht 67.0 in | Wt 258.8 lb

## 2019-12-01 DIAGNOSIS — I10 Essential (primary) hypertension: Secondary | ICD-10-CM

## 2019-12-01 DIAGNOSIS — E782 Mixed hyperlipidemia: Secondary | ICD-10-CM

## 2019-12-01 DIAGNOSIS — R0602 Shortness of breath: Secondary | ICD-10-CM

## 2019-12-01 DIAGNOSIS — I2510 Atherosclerotic heart disease of native coronary artery without angina pectoris: Secondary | ICD-10-CM | POA: Diagnosis not present

## 2019-12-01 HISTORY — DX: Mixed hyperlipidemia: E78.2

## 2019-12-01 NOTE — Progress Notes (Signed)
Cardiology Office Note:    Date:  12/01/2019   ID:  Dale Carpenter, DOB 04-30-1966, MRN [id number]  PCP:  Noni Saupe, MD  Cardiologist:  Garwin Brothers, MD   Referring MD: Noni Saupe, MD    ASSESSMENT:    1. Coronary artery disease involving native coronary artery of native heart without angina pectoris   2. Essential hypertension   3. Morbid obesity (H[facility])    PLAN:    In order of problems listed above:  1. Coronary artery disease: Secondary prevention stressed with the patient.  Importance of compliance with diet medication stressed and he vocalized understanding.  Importance of regular exercise stressed and weight reduction was stressed. 2. Dyspnea on exertion: I will do a B MP and D-dimer today to see if there are any issues with the patient needs any further evaluation such as with a CT scan to rule out any pulmonary embolism or such issues.  His dyspnea on exertion and feet longstanding. 3. Essential hypertension: Blood pressure stable and diet was emphasized 4. Mixed dyslipidemia: Patient on statin therapy and diet was emphasized and he will have blood work in 2 months for follow-up of dyslipidemia. 5. Morbid obesity: Weight reduction was stressed.  If his D-dimer evaluation is negative then he was advised to start an exercise program and be in touch with his primary care physician for health maintenance. 6. Patient will be seen in follow-up appointment in 3 months or earlier if the patient has any concerns    Medication Adjustments/Labs and Tests Ordered: Current medicines are reviewed at length with the patient today.  Concerns regarding medicines are outlined above.  No orders of the defined types were placed in this encounter.  No orders of the defined types were placed in this encounter.    No chief complaint on file.    History of Present Illness:    Dale Carpenter is a 54 y.o. male.  Patient was evaluated by me for abnormal stress  test.  Coronary angiography was unremarkable and the report is mentioned below.  He denies any problems at this time and takes care of activities of daily living.  No chest pain orthopnea or PND.  However he mentions to me that he is a little concerned about shortness of breath on exertion.  At the time of my evaluation, the patient is alert awake oriented and in no distress.  Past Medical History:  Diagnosis Date  . Abnormal nuclear stress test 03/14/2019  . CAD (coronary artery disease) 04/09/2019  . Chest discomfort 03/10/2019  . Dyspnea on exertion 03/10/2019  . Essential hypertension 03/10/2019  . Hypertension   . Morbid obesity (HCC) 10/30/2019  . Overweight 03/10/2019    Past Surgical History:  Procedure Laterality Date  . LEFT HEART CATH AND CORONARY ANGIOGRAPHY N/A 03/21/2019   Procedure: LEFT HEART CATH AND CORONARY ANGIOGRAPHY;  Surgeon: Corky Crafts, MD;  Location: [facility];  Service: Cardiovascular;  Laterality: N/A;    Current Medications: Current Meds  Medication Sig  . amLODipine (NORVASC) 10 MG tablet Take 10 mg by mouth daily.  Marland Kitchen aspirin EC 81 MG tablet Take 81 mg by mouth daily.  . chlorthalidone (HYGROTON) 25 MG tablet Take 1 tablet (25 mg total) by mouth daily.  . cloNIDine (CATAPRES) 0.1 MG tablet Take 0.1 mg by mouth daily as needed (Pressure over 160/100).   Marland Kitchen escitalopram (LEXAPRO) 10 MG tablet Take 10 mg by mouth daily.  Marland Kitchen  fluticasone (FLONASE) 50 MCG/ACT nasal spray Place 2 sprays into both nostrils daily as needed for allergies.   Marland Kitchen lisinopril (ZESTRIL) 40 MG tablet TK 1 T PO D  . nitroGLYCERIN (NITROSTAT) 0.4 MG SL tablet Place 0.4 mg under the tongue every 5 (five) minutes as needed for chest pain.   . rosuvastatin (CRESTOR) 20 MG tablet Take 1 tablet (20 mg total) by mouth at bedtime.  . VENTOLIN HFA 108 (90 Base) MCG/ACT inhaler Inhale 2 puffs into the lungs every 6 (six) hours as needed for wheezing or shortness of breath.      Allergies:    Patient has no known allergies.   Social History   Socioeconomic History  . Marital status: Married    Spouse name: Not on file  . Number of children: Not on file  . Years of education: Not on file  . Highest education level: Not on file  Occupational History  . Not on file  Tobacco Use  . Smoking status: Never Smoker  . Smokeless tobacco: Current User    Types: Chew  Substance and Sexual Activity  . Alcohol use: Not on file  . Drug use: Not on file  . Sexual activity: Not on file  Other Topics Concern  . Not on file  Social History Narrative  . Not on file   Social Determinants of Health   Financial Resource Strain:   . Difficulty of Paying Living Expenses:   Food Insecurity:   . Worried About Programme researcher, broadcasting/film/video in the Last Year:   . Barista in the Last Year:   Transportation Needs:   . Freight forwarder (Medical):   Marland Kitchen Lack of Transportation (Non-Medical):   Physical Activity:   . Days of Exercise per Week:   . Minutes of Exercise per Session:   Stress:   . Feeling of Stress :   Social Connections:   . Frequency of Communication with Friends and Family:   . Frequency of Social Gatherings with Friends and Family:   . Attends Religious Services:   . Active Member of Clubs or Organizations:   . Attends Banker Meetings:   Marland Kitchen Marital Status:      Family History: The patient's family history is not on file.  ROS:   Please see the history of present illness.    All other systems reviewed and are negative.  EKGs/Labs/Other Studies Reviewed:    The following studies were reviewed today: Corky Crafts, MD (Primary)    Procedures  LEFT HEART CATH AND CORONARY ANGIOGRAPHY  Conclusion    Prox RCA lesion is 25% stenosed.  Mid LAD lesion is 10% stenosed.  The left ventricular systolic function is normal.  LV end diastolic pressure is mildly elevated. LVEDP 17 mm Hg  The left ventricular ejection fraction is 50-55% by  visual estimate.  There is no aortic valve stenosis.   No significant CAD.  Continue preventive therapy.       Recent Labs: 11/06/2019: ALT 17; BUN 25; Creatinine, Ser 0.94; Hemoglobin 16.3; Platelets 280; Potassium 4.4; Sodium 135; TSH 2.860  Recent Lipid Panel    Component Value Date/Time   CHOL 207 (H) 11/06/2019 1037   TRIG 130 11/06/2019 1037   HDL 43 11/06/2019 1037   CHOLHDL 4.8 11/06/2019 1037   LDLCALC 141 (H) 11/06/2019 1037    Physical Exam:    VS:  BP 118/78   Pulse 78   Temp 97.9 F (36.6 C)  Ht 5\' 7"  (1.702 m)   Wt 258 lb 12.8 oz (117.4 kg)   SpO2 99%   BMI 40.53 kg/m     Wt Readings from Last 3 Encounters:  12/01/19 258 lb 12.8 oz (117.4 kg)  10/30/19 263 lb (119.3 kg)  04/09/19 256 lb (116.1 kg)     GEN: Patient is in no acute distress HEENT: Normal NECK: No JVD; No carotid bruits LYMPHATICS: No lymphadenopathy CARDIAC: Hear sounds regular, 2/6 systolic murmur at the apex. RESPIRATORY:  Clear to auscultation without rales, wheezing or rhonchi  ABDOMEN: Soft, non-tender, non-distended MUSCULOSKELETAL:  No edema; No deformity  SKIN: Warm and dry NEUROLOGIC:  Alert and oriented x 3 PSYCHIATRIC:  Normal affect   Signed, Jenean Lindau, MD  12/01/2019 11:45 AM    [place]

## 2019-12-01 NOTE — Patient Instructions (Signed)
Medication Instructions:  No medication changes *If you need a refill on your cardiac medications before your next appointment, please call your pharmacy*   Lab Work: Your physician recommends that you have a BMET and d-dimer today.  Your physician recommends that you return for lab work in: 2 months. You need to have labs done when you are fasting.  You can come Monday through Friday 8:30 am to 12:00 pm and 1:15 to 4:30. You do not need to make an appointment as the order has already been placed. The labs you are going to have done are BMET, CBC, TSH, LFT and Lipids.    If you have labs (blood work) drawn today and your tests are completely normal, you will receive your results only by: Marland Kitchen MyChart Message (if you have MyChart) OR . A paper copy in the mail If you have any lab test that is abnormal or we need to change your treatment, we will call you to review the results.   Testing/Procedures: None ordered   Follow-Up: At [facility], you and your health needs are our priority.  As part of our continuing mission to provide you with exceptional heart care, we have created designated Provider Care Teams.  These Care Teams include your primary Cardiologist (physician) and Advanced Practice Providers (APPs -  Physician Assistants and Nurse Practitioners) who all work together to provide you with the care you need, when you need it.  We recommend signing up for the patient portal called "MyChart".  Sign up information is provided on this After Visit Summary.  MyChart is used to connect with patients for Virtual Visits (Telemedicine).  Patients are able to view lab/test results, encounter notes, upcoming appointments, etc.  Non-urgent messages can be sent to your provider as well.   To learn more about what you can do with MyChart, go to [email or web address].    Your next appointment:   3 month(s)  The format for your next appointment:   In Person  Provider:   Belva Crome,  MD   Other Instructions NA

## 2019-12-01 NOTE — Addendum Note (Signed)
Addended by: Eleonore Chiquito on: 12/01/2019 12:12 PM   Modules accepted: Orders

## 2019-12-02 LAB — BASIC METABOLIC PANEL
BUN/Creatinine Ratio: 10 (ref 9–20)
BUN: 9 mg/dL (ref 6–24)
CO2: 23 mmol/L (ref 20–29)
Calcium: 9.3 mg/dL (ref 8.7–10.2)
Chloride: 97 mmol/L (ref 96–106)
Creatinine, Ser: 0.93 mg/dL (ref 0.76–1.27)
GFR calc Af Amer: 107 mL/min/{1.73_m2} (ref >59)
GFR calc non Af Amer: 93 mL/min/{1.73_m2} (ref >59)
Glucose: 105 mg/dL — ABNORMAL HIGH (ref 65–99)
Potassium: 3.9 mmol/L (ref 3.5–5.2)
Sodium: 135 mmol/L (ref 134–144)

## 2019-12-02 LAB — D-DIMER, QUANTITATIVE: D-DIMER: 0.38 mg/L FEU (ref 0.00–0.49)

## 2019-12-03 ENCOUNTER — Telehealth: Payer: Self-pay | Admitting: Cardiology

## 2019-12-03 NOTE — Telephone Encounter (Signed)
[employer] is calling wanting to come by today to pick up a release to work note today due to his results coming back good and Dr. Tomie China advising him he would allow him to go back to work if they did. Please advise.

## 2019-12-04 NOTE — Telephone Encounter (Signed)
Pt is aware and will come by today and pick up his letter.

## 2019-12-04 NOTE — Telephone Encounter (Signed)
From my side he should be okay to go to work from a cardiac standpoint.  He might also want to check with his primary care.

## 2019-12-04 NOTE — Telephone Encounter (Signed)
Pt's labs were normal Can he return to work?

## 2019-12-10 ENCOUNTER — Telehealth: Payer: Self-pay | Admitting: Cardiology

## 2019-12-10 NOTE — Telephone Encounter (Signed)
[facility] is faxing over a release form to get the patients latest EKG faxed back to them. They are requesting to have it faxed over in the next half hour to an hour. I advised I would send a message to Triage and make sure they are on the lookout.

## 2019-12-10 NOTE — Telephone Encounter (Signed)
ekg faxed to urgent care as requested

## 2020-03-10 ENCOUNTER — Other Ambulatory Visit: Payer: Self-pay

## 2020-03-10 ENCOUNTER — Ambulatory Visit: Payer: 59 | Admitting: Cardiology

## 2020-03-10 ENCOUNTER — Encounter: Payer: Self-pay | Admitting: Cardiology

## 2020-03-10 VITALS — BP 124/84 | HR 82 | Ht 67.0 in | Wt 258.4 lb

## 2020-03-10 DIAGNOSIS — I10 Essential (primary) hypertension: Secondary | ICD-10-CM

## 2020-03-10 DIAGNOSIS — E663 Overweight: Secondary | ICD-10-CM

## 2020-03-10 DIAGNOSIS — E782 Mixed hyperlipidemia: Secondary | ICD-10-CM

## 2020-03-10 DIAGNOSIS — I2510 Atherosclerotic heart disease of native coronary artery without angina pectoris: Secondary | ICD-10-CM | POA: Diagnosis not present

## 2020-03-10 MED ORDER — NEBIVOLOL HCL 10 MG PO TABS
10.0000 mg | ORAL_TABLET | Freq: Every day | ORAL | 3 refills | Status: DC
Start: 2020-03-10 — End: 2022-12-22

## 2020-03-10 NOTE — Progress Notes (Signed)
Cardiology Office Note:    Date:  03/10/2020   ID:  Dale Carpenter, DOB Nov 06, 1965, MRN [id number]  PCP:  Noni Saupe, MD  Cardiologist:  Garwin Brothers, MD   Referring MD: Noni Saupe, MD    ASSESSMENT:    1. Coronary artery disease involving native coronary artery of native heart without angina pectoris   2. Essential hypertension   3. Mixed dyslipidemia   4. Morbid obesity (HCC)   5. Overweight    PLAN:    In order of problems listed above:  1. Coronary artery disease: Secondary prevention stressed with the patient.  Importance of compliance with diet medication stressed and he vocalized understanding. 2. Essential hypertension: Blood pressure is stable however he mentions to me that he cannot take chlorthalidone because of frequent urination and this is a big inconvenience for him at his job and therefore he wants to change it.  I will stop this medicine and change it to Bystolic 10 mg daily.  He will keep a track of his blood pressures at home and send [place] the log.  We will give it and adjust medications accordingly. 3. Mixed dyslipidemia: Managed by primary care physician.  Patient on statin therapy 4. Morbid obesity: Diet was emphasized risks of obesity explained and he vocalized understanding and promises to do better. 5. Patient will be seen in follow-up appointment in 6 months or earlier if the patient has any concerns    Medication Adjustments/Labs and Tests Ordered: Current medicines are reviewed at length with the patient today.  Concerns regarding medicines are outlined above.  No orders of the defined types were placed in this encounter.  No orders of the defined types were placed in this encounter.    No chief complaint on file.    History of Present Illness:    Dale Carpenter is a 54 y.o. male.  Patient underwent coronary angiography and was diagnosed with very mild nonobstructive disease.  He has history of essential  hypertension dyslipidemia and morbid obesity.  He denies any problems at this time.  No chest pain orthopnea or PND.  He leads a sedentary lifestyle.  At the time of my evaluation, the patient is alert awake oriented and in no distress.  Past Medical History:  Diagnosis Date  . Abnormal nuclear stress test 03/14/2019  . CAD (coronary artery disease) 04/09/2019  . Chest discomfort 03/10/2019  . Dyspnea on exertion 03/10/2019  . Essential hypertension 03/10/2019  . Hypertension   . Morbid obesity (HCC) 10/30/2019  . Overweight 03/10/2019    Past Surgical History:  Procedure Laterality Date  . LEFT HEART CATH AND CORONARY ANGIOGRAPHY N/A 03/21/2019   Procedure: LEFT HEART CATH AND CORONARY ANGIOGRAPHY;  Surgeon: Corky Crafts, MD;  Location: [facility];  Service: Cardiovascular;  Laterality: N/A;    Current Medications: Current Meds  Medication Sig  . amLODipine (NORVASC) 10 MG tablet Take 10 mg by mouth daily.  Marland Kitchen aspirin EC 81 MG tablet Take 81 mg by mouth daily.  . cloNIDine (CATAPRES) 0.1 MG tablet Take 0.1 mg by mouth daily as needed (Pressure over 160/100).   Marland Kitchen escitalopram (LEXAPRO) 10 MG tablet Take 10 mg by mouth daily.  . fluticasone (FLONASE) 50 MCG/ACT nasal spray Place 2 sprays into both nostrils daily as needed for allergies.   Marland Kitchen lisinopril (ZESTRIL) 40 MG tablet TK 1 T PO D  . nitroGLYCERIN (NITROSTAT) 0.4 MG SL tablet Place 0.4 mg under the  tongue every 5 (five) minutes as needed for chest pain.   . rosuvastatin (CRESTOR) 20 MG tablet Take 1 tablet (20 mg total) by mouth at bedtime.  . VENTOLIN HFA 108 (90 Base) MCG/ACT inhaler Inhale 2 puffs into the lungs every 6 (six) hours as needed for wheezing or shortness of breath.      Allergies:   Patient has no known allergies.   Social History   Socioeconomic History  . Marital status: Married    Spouse name: Not on file  . Number of children: Not on file  . Years of education: Not on file  . Highest education  level: Not on file  Occupational History  . Not on file  Tobacco Use  . Smoking status: Never Smoker  . Smokeless tobacco: Current User    Types: Chew  Substance and Sexual Activity  . Alcohol use: Not on file  . Drug use: Not on file  . Sexual activity: Not on file  Other Topics Concern  . Not on file  Social History Narrative  . Not on file   Social Determinants of Health   Financial Resource Strain:   . Difficulty of Paying Living Expenses:   Food Insecurity:   . Worried About Programme researcher, broadcasting/film/video in the Last Year:   . Barista in the Last Year:   Transportation Needs:   . Freight forwarder (Medical):   Marland Kitchen Lack of Transportation (Non-Medical):   Physical Activity:   . Days of Exercise per Week:   . Minutes of Exercise per Session:   Stress:   . Feeling of Stress :   Social Connections:   . Frequency of Communication with Friends and Family:   . Frequency of Social Gatherings with Friends and Family:   . Attends Religious Services:   . Active Member of Clubs or Organizations:   . Attends Banker Meetings:   Marland Kitchen Marital Status:      Family History: The patient's family history is not on file.  ROS:   Please see the history of present illness.    All other systems reviewed and are negative.  EKGs/Labs/Other Studies Reviewed:    The following studies were reviewed today: LEFT HEART CATH AND CORONARY ANGIOGRAPHY  Conclusion    Prox RCA lesion is 25% stenosed.  Mid LAD lesion is 10% stenosed.  The left ventricular systolic function is normal.  LV end diastolic pressure is mildly elevated. LVEDP 17 mm Hg  The left ventricular ejection fraction is 50-55% by visual estimate.  There is no aortic valve stenosis.   No significant CAD.  Continue preventive therapy.   Recent Labs: 11/06/2019: ALT 17; Hemoglobin 16.3; Platelets 280; TSH 2.860 12/01/2019: BUN 9; Creatinine, Ser 0.93; Potassium 3.9; Sodium 135  Recent Lipid Panel      Component Value Date/Time   CHOL 207 (H) 11/06/2019 1037   TRIG 130 11/06/2019 1037   HDL 43 11/06/2019 1037   CHOLHDL 4.8 11/06/2019 1037   LDLCALC 141 (H) 11/06/2019 1037    Physical Exam:    VS:  BP 124/84   Pulse 82   Ht 5\' 7"  (1.702 m)   Wt 258 lb 6.4 oz (117.2 kg)   SpO2 98%   BMI 40.47 kg/m     Wt Readings from Last 3 Encounters:  03/10/20 258 lb 6.4 oz (117.2 kg)  12/01/19 258 lb 12.8 oz (117.4 kg)  10/30/19 263 lb (119.3 kg)     GEN: Patient  is in no acute distress HEENT: Normal NECK: No JVD; No carotid bruits LYMPHATICS: No lymphadenopathy CARDIAC: Hear sounds regular, 2/6 systolic murmur at the apex. RESPIRATORY:  Clear to auscultation without rales, wheezing or rhonchi  ABDOMEN: Soft, non-tender, non-distended MUSCULOSKELETAL:  No edema; No deformity  SKIN: Warm and dry NEUROLOGIC:  Alert and oriented x 3 PSYCHIATRIC:  Normal affect   Signed, Garwin Brothers, MD  03/10/2020 1:07 PM    [facility]

## 2020-03-10 NOTE — Patient Instructions (Signed)
Medication Instructions:  Your physician has recommended you make the following change in your medication:   Stop Chlorthiladone. Start Bystolic 10 mg daily.  *If you need a refill on your cardiac medications before your next appointment, please call your pharmacy*   Lab Work: None ordered If you have labs (blood work) drawn today and your tests are completely normal, you will receive your results only by: Marland Kitchen MyChart Message (if you have MyChart) OR . A paper copy in the mail If you have any lab test that is abnormal or we need to change your treatment, we will call you to review the results.   Testing/Procedures: None ordered   Follow-Up: At [facility], you and your health needs are our priority.  As part of our continuing mission to provide you with exceptional heart care, we have created designated Provider Care Teams.  These Care Teams include your primary Cardiologist (physician) and Advanced Practice Providers (APPs -  Physician Assistants and Nurse Practitioners) who all work together to provide you with the care you need, when you need it.  We recommend signing up for the patient portal called "MyChart".  Sign up information is provided on this After Visit Summary.  MyChart is used to connect with patients for Virtual Visits (Telemedicine).  Patients are able to view lab/test results, encounter notes, upcoming appointments, etc.  Non-urgent messages can be sent to your provider as well.   To learn more about what you can do with MyChart, go to [email or web address].    Your next appointment:   6 month(s)  The format for your next appointment:   In Person  Provider:   Belva Crome, MD   Other Instructions   Please send [place] a record of your blood pressure in MyChart or drop it off at the office.  Blood Pressure Record Sheet To take your blood pressure, you will need a blood pressure machine. You can buy a blood pressure machine (blood pressure monitor) at your  clinic, drug store, or online. When choosing one, consider:  An automatic monitor that has an arm cuff.  A cuff that wraps snugly around your upper arm. You should be able to fit only one finger between your arm and the cuff.  A device that stores blood pressure reading results.  Do not choose a monitor that measures your blood pressure from your wrist or finger. Follow your health care provider's instructions for how to take your blood pressure. To use this form:  Get one reading in the morning (a.m.) 2 hours after you take your medicines.  Get one reading in the evening (p.m.) before supper.  Take at least 2 readings with each blood pressure check. This makes sure the results are correct. Wait 1-2 minutes between measurements.  Write down the results in the spaces on this form.  Repeat this once a week, or as told by your health care provider.  Make a follow-up appointment with your health care provider to discuss the results. Blood pressure log Date: _______________________  a.m. _____________________(1st reading) _____________________(2nd reading)  p.m. _____________________(1st reading) _____________________(2nd reading) Date: _______________________  a.m. _____________________(1st reading) _____________________(2nd reading)  p.m. _____________________(1st reading) _____________________(2nd reading) Date: _______________________  a.m. _____________________(1st reading) _____________________(2nd reading)  p.m. _____________________(1st reading) _____________________(2nd reading) Date: _______________________  a.m. _____________________(1st reading) _____________________(2nd reading)  p.m. _____________________(1st reading) _____________________(2nd reading) Date: _______________________  a.m. _____________________(1st reading) _____________________(2nd reading)  p.m. _____________________(1st reading) _____________________(2nd reading) This information is not  intended to replace advice given to you by your health  care provider. Make sure you discuss any questions you have with your health care provider. Document Revised: 09/21/2017 Document Reviewed: 07/24/2017 Elsevier Patient Education  2020 [employer].

## 2020-03-29 IMAGING — DX CHEST - 2 VIEW
2 series · 2 of 2 positions shown · non-contrast
Comparison: 03/20/2011

CLINICAL DATA: Chest tightness, shortness of breath.  Hypertension

EXAM:
CHEST - 2 VIEW

[w chest pa]
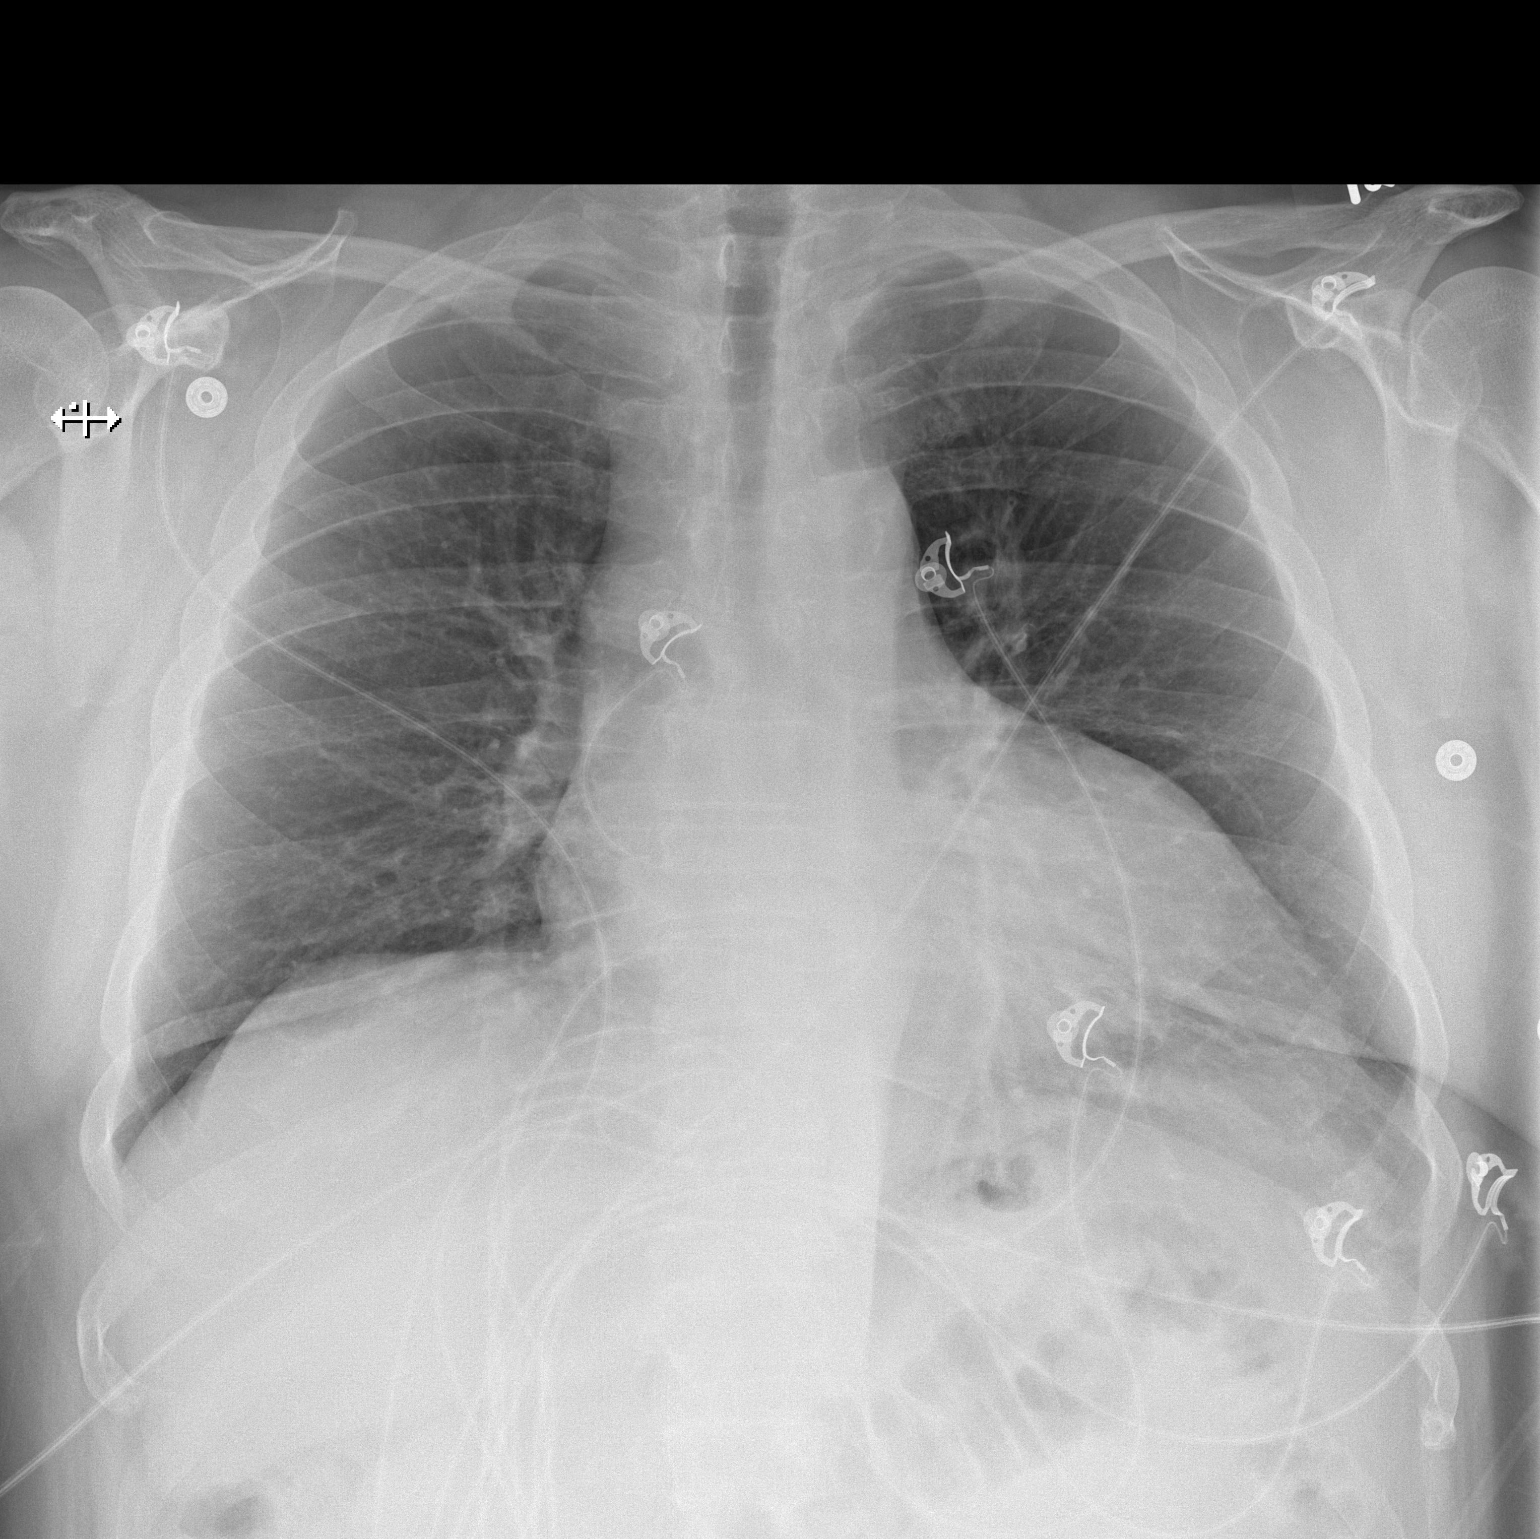

[w chest lat]
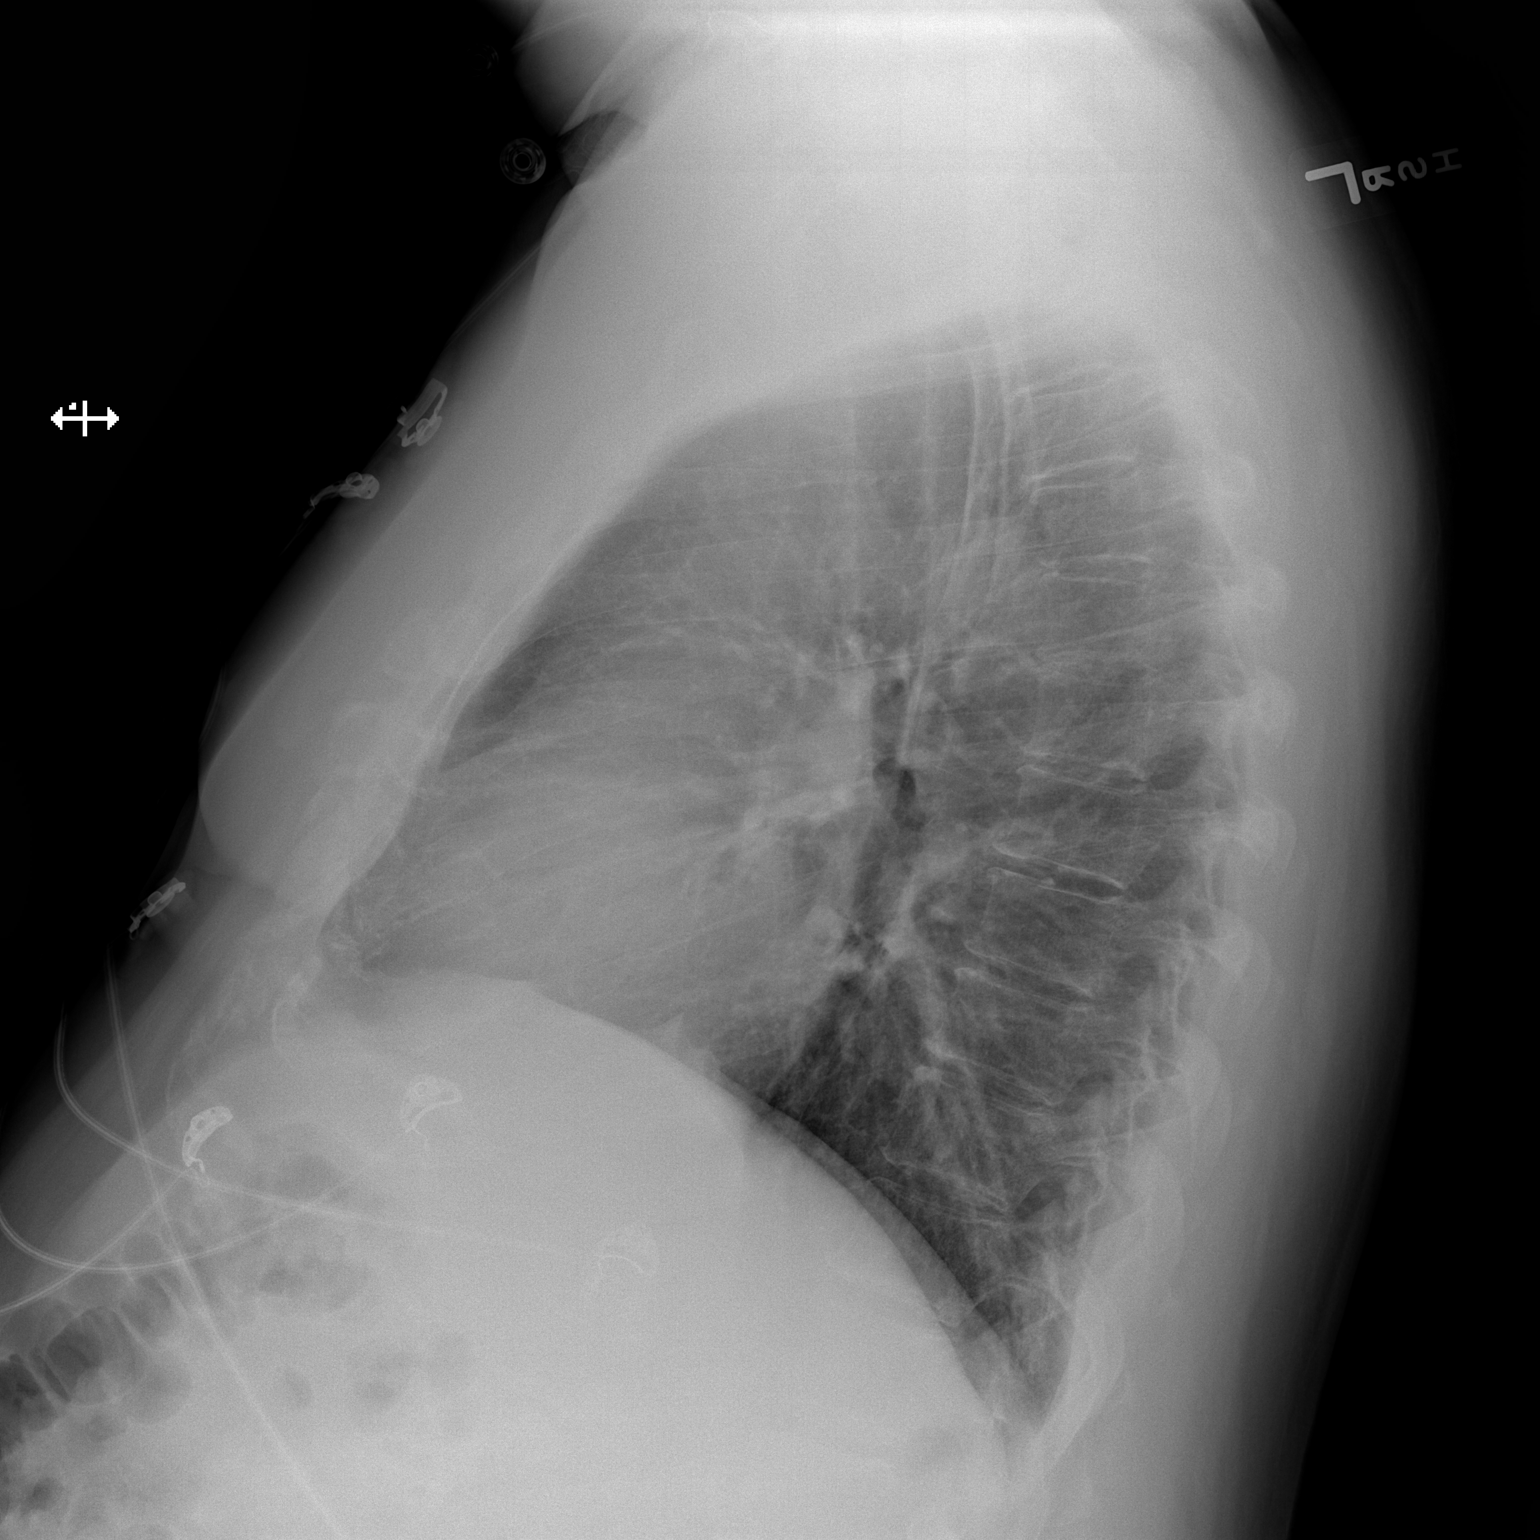

[2 of 2 positions shown; findings below may reference images not displayed]

FINDINGS: The heart size and mediastinal contours are within normal limits.
Both lungs are clear. The visualized skeletal structures are
unremarkable.
IMPRESSION: No active cardiopulmonary disease.

## 2020-09-03 ENCOUNTER — Other Ambulatory Visit: Payer: Self-pay

## 2020-09-03 DIAGNOSIS — I10 Essential (primary) hypertension: Secondary | ICD-10-CM | POA: Insufficient documentation

## 2020-09-10 ENCOUNTER — Other Ambulatory Visit: Payer: Self-pay

## 2020-09-10 ENCOUNTER — Encounter: Payer: Self-pay | Admitting: Cardiology

## 2020-09-10 ENCOUNTER — Ambulatory Visit: Payer: 59 | Admitting: Cardiology

## 2020-09-10 VITALS — BP 122/68 | HR 74 | Ht 67.0 in | Wt 264.0 lb

## 2020-09-10 DIAGNOSIS — I2510 Atherosclerotic heart disease of native coronary artery without angina pectoris: Secondary | ICD-10-CM | POA: Diagnosis not present

## 2020-09-10 DIAGNOSIS — I10 Essential (primary) hypertension: Secondary | ICD-10-CM | POA: Diagnosis not present

## 2020-09-10 DIAGNOSIS — E782 Mixed hyperlipidemia: Secondary | ICD-10-CM

## 2020-09-10 MED ORDER — LISINOPRIL 40 MG PO TABS: 40.0000 mg | ORAL_TABLET | Freq: Every day | ORAL | 3 refills | Status: DC

## 2020-09-10 NOTE — Patient Instructions (Signed)
Medication Instructions:  Your physician recommends that you continue on your current medications as directed. Please refer to the Current Medication list given to you today. *If you need a refill on your cardiac medications before your next appointment, please call your pharmacy*   Lab Work: Your physician recommends that you return for lab work in: before your next appt. You need to have labs done when you are fasting.  You can come Monday through Friday 8:30 am to 12:00 pm and 1:15 to 4:30. You do not need to make an appointment as the order has already been placed. The labs you are going to have done are BMET, CBC, TSH, LFT and Lipids.  If you have labs (blood work) drawn today and your tests are completely normal, you will receive your results only by: Marland Kitchen MyChart Message (if you have MyChart) OR . A paper copy in the mail If you have any lab test that is abnormal or we need to change your treatment, we will call you to review the results.   Testing/Procedures: None ordered   Follow-Up: At [facility], you and your health needs are our priority.  As part of our continuing mission to provide you with exceptional heart care, we have created designated Provider Care Teams.  These Care Teams include your primary Cardiologist (physician) and Advanced Practice Providers (APPs -  Physician Assistants and Nurse Practitioners) who all work together to provide you with the care you need, when you need it.  We recommend signing up for the patient portal called "MyChart".  Sign up information is provided on this After Visit Summary.  MyChart is used to connect with patients for Virtual Visits (Telemedicine).  Patients are able to view lab/test results, encounter notes, upcoming appointments, etc.  Non-urgent messages can be sent to your provider as well.   To learn more about what you can do with MyChart, go to [email or web address].    Your next appointment:   6 month(s)  The format for your  next appointment:   In Person  Provider:   Belva Crome, MD   Other Instructions NA

## 2020-09-10 NOTE — Addendum Note (Signed)
Addended by: Eleonore Chiquito on: 09/10/2020 01:36 PM   Modules accepted: Orders

## 2020-09-10 NOTE — Progress Notes (Signed)
Cardiology Office Note:    Date:  09/10/2020   ID:  Dale Carpenter, DOB Dec 22, 1965, MRN [id number]  PCP:  Noni Saupe, MD  Cardiologist:  Garwin Brothers, MD   Referring MD: Noni Saupe, MD    ASSESSMENT:    1. Coronary artery disease involving native coronary artery of native heart without angina pectoris   2. Essential hypertension   3. Morbid obesity (HCC)   4. Mixed dyslipidemia    PLAN:    In order of problems listed above:  1. Coronary artery disease: Secondary prevention stressed with the patient.  Importance of compliance with diet medication stressed any vocalized understanding.  I told him to walk at least half an hour a day 5 days a week and he promises to do so. 2. Essential hypertension: Blood pressure stable and diet was emphasized.  He is happy with his blood pressure readings. 3. Mixed dyslipidemia: Diet was emphasized.  Weight reduction was stressed.  He is going to work exclusively and exercise and weight loss.  I reviewed risks of obesity with him exclusively and he is motivated to lose weight.  He is going to keep a close track of his diet and weight and promises to do better in next appointment. 4. Patient will be seen in follow-up appointment in 6 months or earlier if the patient has any concerns.  I told him to come back fasting before his next appointment for complete blood work.   Medication Adjustments/Labs and Tests Ordered: Current medicines are reviewed at length with the patient today.  Concerns regarding medicines are outlined above.  No orders of the defined types were placed in this encounter.  No orders of the defined types were placed in this encounter.    No chief complaint on file.    History of Present Illness:    Dale Carpenter is a 55 y.o. male.  Patient has past medical history of coronary artery disease, essential hypertension and dyslipidemia.  He denies any problems at this time and takes care of  activities of daily living.  He is recovered from Covid infection.  He denies any chest pain orthopnea or PND.  He is very happy with his blood pressure since it has stabilized.  At the time of my evaluation, the patient is alert awake oriented and in no distress.  Past Medical History:  Diagnosis Date  . Abnormal nuclear stress test 03/14/2019  . CAD (coronary artery disease) 04/09/2019  . Chest discomfort 03/10/2019  . Dyspnea on exertion 03/10/2019  . Essential hypertension 03/10/2019  . Hypertension   . Mixed dyslipidemia 12/01/2019  . Morbid obesity (HCC) 10/30/2019  . Overweight 03/10/2019    Past Surgical History:  Procedure Laterality Date  . LEFT HEART CATH AND CORONARY ANGIOGRAPHY N/A 03/21/2019   Procedure: LEFT HEART CATH AND CORONARY ANGIOGRAPHY;  Surgeon: Corky Crafts, MD;  Location: [facility] INVASIVE CV LAB;  Service: Cardiovascular;  Laterality: N/A;    Current Medications: Current Meds  Medication Sig  . amLODipine (NORVASC) 10 MG tablet Take 10 mg by mouth daily.  Marland Kitchen aspirin EC 81 MG tablet Take 81 mg by mouth daily.  . chlorthalidone (HYGROTON) 25 MG tablet Take 25 mg by mouth daily in the afternoon.  . cloNIDine (CATAPRES) 0.1 MG tablet Take 0.1 mg by mouth daily as needed (Pressure over 160/100).   Marland Kitchen escitalopram (LEXAPRO) 10 MG tablet Take 10 mg by mouth daily.  . fluticasone (FLONASE) 50 MCG/ACT nasal spray  Place 2 sprays into both nostrils daily as needed for allergies.   Marland Kitchen lisinopril (ZESTRIL) 40 MG tablet Take 40 mg by mouth daily in the afternoon.  . nebivolol (BYSTOLIC) 10 MG tablet Take 1 tablet (10 mg total) by mouth daily.  . nitroGLYCERIN (NITROSTAT) 0.4 MG SL tablet Place 0.4 mg under the tongue every 5 (five) minutes as needed for chest pain.   . rosuvastatin (CRESTOR) 20 MG tablet Take 1 tablet (20 mg total) by mouth at bedtime.  . VENTOLIN HFA 108 (90 Base) MCG/ACT inhaler Inhale 2 puffs into the lungs every 6 (six) hours as needed for wheezing or shortness of  breath.      Allergies:   Patient has no known allergies.   Social History   Socioeconomic History  . Marital status: Married    Spouse name: Not on file  . Number of children: Not on file  . Years of education: Not on file  . Highest education level: Not on file  Occupational History  . Not on file  Tobacco Use  . Smoking status: Never Smoker  . Smokeless tobacco: Current User    Types: Chew  Substance and Sexual Activity  . Alcohol use: Not on file  . Drug use: Not on file  . Sexual activity: Not on file  Other Topics Concern  . Not on file  Social History Narrative  . Not on file   Social Determinants of Health   Financial Resource Strain: Not on file  Food Insecurity: Not on file  Transportation Needs: Not on file  Physical Activity: Not on file  Stress: Not on file  Social Connections: Not on file     Family History: The patient's family history is not on file.  ROS:   Please see the history of present illness.    All other systems reviewed and are negative.  EKGs/Labs/Other Studies Reviewed:    The following studies were reviewed today: I discussed my findings with the patient at length and EKG reveals sinus rhythm and nonspecific ST-T changes   Recent Labs: 11/06/2019: ALT 17; Hemoglobin 16.3; Platelets 280; TSH 2.860 12/01/2019: BUN 9; Creatinine, Ser 0.93; Potassium 3.9; Sodium 135  Recent Lipid Panel    Component Value Date/Time   CHOL 207 (H) 11/06/2019 1037   TRIG 130 11/06/2019 1037   HDL 43 11/06/2019 1037   CHOLHDL 4.8 11/06/2019 1037   LDLCALC 141 (H) 11/06/2019 1037    Physical Exam:    VS:  BP 122/68   Pulse 74   Ht 5\' 7"  (1.702 m)   Wt 264 lb (119.7 kg)   SpO2 98%   BMI 41.35 kg/m     Wt Readings from Last 3 Encounters:  09/10/20 264 lb (119.7 kg)  03/10/20 258 lb 6.4 oz (117.2 kg)  12/01/19 258 lb 12.8 oz (117.4 kg)     GEN: Patient is in no acute distress HEENT: Normal NECK: No JVD; No carotid bruits LYMPHATICS: No  lymphadenopathy CARDIAC: Hear sounds regular, 2/6 systolic murmur at the apex. RESPIRATORY:  Clear to auscultation without rales, wheezing or rhonchi  ABDOMEN: Soft, non-tender, non-distended MUSCULOSKELETAL:  No edema; No deformity  SKIN: Warm and dry NEUROLOGIC:  Alert and oriented x 3 PSYCHIATRIC:  Normal affect   Signed, 12/03/19, MD  09/10/2020 1:24 PM    [facility]

## 2021-03-07 ENCOUNTER — Encounter: Payer: Self-pay | Admitting: Cardiology

## 2021-03-11 ENCOUNTER — Ambulatory Visit: Payer: 59 | Admitting: Cardiology

## 2021-03-17 ENCOUNTER — Other Ambulatory Visit: Payer: Self-pay

## 2021-03-18 ENCOUNTER — Ambulatory Visit: Payer: 59 | Admitting: Cardiology

## 2021-03-18 ENCOUNTER — Encounter: Payer: Self-pay | Admitting: Cardiology

## 2021-03-18 ENCOUNTER — Other Ambulatory Visit: Payer: Self-pay

## 2021-03-18 VITALS — BP 116/68 | HR 80 | Ht 67.0 in | Wt 268.6 lb

## 2021-03-18 DIAGNOSIS — E782 Mixed hyperlipidemia: Secondary | ICD-10-CM | POA: Diagnosis not present

## 2021-03-18 DIAGNOSIS — I2510 Atherosclerotic heart disease of native coronary artery without angina pectoris: Secondary | ICD-10-CM

## 2021-03-18 DIAGNOSIS — I10 Essential (primary) hypertension: Secondary | ICD-10-CM | POA: Diagnosis not present

## 2021-03-18 NOTE — Addendum Note (Signed)
Addended by: Eleonore Chiquito on: 03/18/2021 03:36 PM   Modules accepted: Orders

## 2021-03-18 NOTE — Progress Notes (Signed)
Cardiology Office Note:    Date:  03/18/2021   ID:  Dale Carpenter, DOB 05-Jan-1966, MRN [id number]  PCP:  Noni Saupe, MD  Cardiologist:  Garwin Brothers, MD   Referring MD: Noni Saupe, MD    ASSESSMENT:    1. Coronary artery disease involving native coronary artery of native heart without angina pectoris   2. Essential hypertension   3. Primary hypertension   4. Mixed dyslipidemia   5. Morbid obesity (HCC)    PLAN:    In order of problems listed above:  Coronary artery disease: Secondary prevention stressed with the patient.  Importance of compliance with diet medication stressed any vocalized understanding.  He was advised to walk at least half an hour a day 5 days a week and he promises to do so. Essential hypertension: Blood pressure stable and diet was emphasized. Mixed dyslipidemia: He has not had lipid work done for some time and he will be back in the next few days for complete blood work.  Previous blood work was reviewed and discussed with him and lifestyle modification and diet urged. Obesity: Weight reduction stressed.  Risks of obesity emphasized and he promises to do better. Patient will be seen in follow-up appointment in 6 months or earlier if the patient has any concerns    Medication Adjustments/Labs and Tests Ordered: Current medicines are reviewed at length with the patient today.  Concerns regarding medicines are outlined above.  No orders of the defined types were placed in this encounter.  No orders of the defined types were placed in this encounter.    No chief complaint on file.    History of Present Illness:    Dale Carpenter is a 55 y.o. male.  Patient has past medical history of nonobstructive coronary artery disease, essential hypertension dyslipidemia and morbid obesity.  He denies any problems at this time and takes care of activities of daily living.  No chest pain orthopnea or PND.  At the time of my  evaluation, the patient is alert awake oriented and in no distress.  He leads a sedentary lifestyle.  He mentions to me that he has been lax with diet and exercise.  He has gained weight.  Past Medical History:  Diagnosis Date   Abnormal nuclear stress test 03/14/2019   CAD (coronary artery disease) 04/09/2019   Chest discomfort 03/10/2019   Dyspnea on exertion 03/10/2019   Essential hypertension 03/10/2019   Hypertension    Mixed dyslipidemia 12/01/2019   Morbid obesity (HCC) 10/30/2019   Overweight 03/10/2019    Past Surgical History:  Procedure Laterality Date   HERNIA REPAIR     LEFT HEART CATH AND CORONARY ANGIOGRAPHY N/A 03/21/2019   Procedure: LEFT HEART CATH AND CORONARY ANGIOGRAPHY;  Surgeon: Corky Crafts, MD;  Location: [facility] INVASIVE CV LAB;  Service: Cardiovascular;  Laterality: N/A;    Current Medications: Current Meds  Medication Sig   amLODipine (NORVASC) 10 MG tablet Take 10 mg by mouth daily.   aspirin EC 81 MG tablet Take 81 mg by mouth daily.   chlorthalidone (HYGROTON) 25 MG tablet Take 25 mg by mouth daily in the afternoon.   cloNIDine (CATAPRES) 0.1 MG tablet Take 0.1 mg by mouth daily as needed for high blood pressure (Pressure over 160/100). Pressure over 160/100   escitalopram (LEXAPRO) 10 MG tablet Take 10 mg by mouth daily.   fluticasone (FLONASE) 50 MCG/ACT nasal spray Place 2 sprays into both nostrils daily as  needed for allergies.    lisinopril (ZESTRIL) 40 MG tablet Take 1 tablet (40 mg total) by mouth daily in the afternoon.   nebivolol (BYSTOLIC) 10 MG tablet Take 1 tablet (10 mg total) by mouth daily.   nitroGLYCERIN (NITROSTAT) 0.4 MG SL tablet Place 0.4 mg under the tongue every 5 (five) minutes as needed for chest pain.    rosuvastatin (CRESTOR) 20 MG tablet Take 1 tablet (20 mg total) by mouth at bedtime.   VENTOLIN HFA 108 (90 Base) MCG/ACT inhaler Inhale 2 puffs into the lungs every 6 (six) hours as needed for wheezing or shortness of breath.       Allergies:   Patient has no known allergies.   Social History   Socioeconomic History   Marital status: Married    Spouse name: Not on file   Number of children: Not on file   Years of education: Not on file   Highest education level: Not on file  Occupational History   Not on file  Tobacco Use   Smoking status: Never   Smokeless tobacco: Current    Types: Chew  Substance and Sexual Activity   Alcohol use: Not on file   Drug use: Not on file   Sexual activity: Not on file  Other Topics Concern   Not on file  Social History Narrative   Not on file   Social Determinants of Health   Financial Resource Strain: Not on file  Food Insecurity: Not on file  Transportation Needs: Not on file  Physical Activity: Not on file  Stress: Not on file  Social Connections: Not on file     Family History: The patient's family history includes Fibromyalgia in his sister; GI problems in his sister; Heart Problems in his maternal grandfather; Heart attack in his paternal grandfather; Hypertension in his father and mother; Kidney disease in his father.  ROS:   Please see the history of present illness.    All other systems reviewed and are negative.  EKGs/Labs/Other Studies Reviewed:    The following studies were reviewed today: I discussed the findings with the patient at length.   Recent Labs: No results found for requested labs within last 8760 hours.  Recent Lipid Panel    Component Value Date/Time   CHOL 207 (H) 11/06/2019 1037   TRIG 130 11/06/2019 1037   HDL 43 11/06/2019 1037   CHOLHDL 4.8 11/06/2019 1037   LDLCALC 141 (H) 11/06/2019 1037    Physical Exam:    VS:  BP 116/68 (BP Location: Left Arm, Patient Position: Sitting)   Pulse 80   Ht 5\' 7"  (1.702 m)   Wt 268 lb 9.6 oz (121.8 kg)   SpO2 99%   BMI 42.07 kg/m     Wt Readings from Last 3 Encounters:  03/18/21 268 lb 9.6 oz (121.8 kg)  09/10/20 264 lb (119.7 kg)  03/10/20 258 lb 6.4 oz (117.2 kg)      GEN: Patient is in no acute distress HEENT: Normal NECK: No JVD; No carotid bruits LYMPHATICS: No lymphadenopathy CARDIAC: Hear sounds regular, 2/6 systolic murmur at the apex. RESPIRATORY:  Clear to auscultation without rales, wheezing or rhonchi  ABDOMEN: Soft, non-tender, non-distended MUSCULOSKELETAL:  No edema; No deformity  SKIN: Warm and dry NEUROLOGIC:  Alert and oriented x 3 PSYCHIATRIC:  Normal affect   Signed, 05/10/20, MD  03/18/2021 3:27 PM    [facility]

## 2021-03-18 NOTE — Patient Instructions (Addendum)

## 2021-03-21 ENCOUNTER — Other Ambulatory Visit: Payer: Self-pay | Admitting: Cardiology

## 2021-03-23 LAB — CBC WITH DIFFERENTIAL/PLATELET
Basophils Absolute: 0 10*3/uL (ref 0.0–0.2)
Basos: 1 %
EOS (ABSOLUTE): 0.1 10*3/uL (ref 0.0–0.4)
Eos: 2 %
Hematocrit: 45.2 % (ref 37.5–51.0)
Hemoglobin: 15.2 g/dL (ref 13.0–17.7)
Immature Grans (Abs): 0 10*3/uL (ref 0.0–0.1)
Immature Granulocytes: 0 %
Lymphocytes Absolute: 2.1 10*3/uL (ref 0.7–3.1)
Lymphs: 40 %
MCH: 30.8 pg (ref 26.6–33.0)
MCHC: 33.6 g/dL (ref 31.5–35.7)
MCV: 92 fL (ref 79–97)
Monocytes Absolute: 0.4 10*3/uL (ref 0.1–0.9)
Monocytes: 8 %
Neutrophils Absolute: 2.7 10*3/uL (ref 1.4–7.0)
Neutrophils: 49 %
Platelets: 285 10*3/uL (ref 150–450)
RBC: 4.94 x10E6/uL (ref 4.14–5.80)
RDW: 13 % (ref 11.6–15.4)
WBC: 5.4 10*3/uL (ref 3.4–10.8)

## 2021-03-23 LAB — LIPID PANEL
Chol/HDL Ratio: 4.3 ratio (ref 0.0–5.0)
Cholesterol, Total: 188 mg/dL (ref 100–199)
HDL: 44 mg/dL (ref >39)
LDL Chol Calc (NIH): 133 mg/dL — ABNORMAL HIGH (ref 0–99)
Triglycerides: 57 mg/dL (ref 0–149)
VLDL Cholesterol Cal: 11 mg/dL (ref 5–40)

## 2021-03-23 LAB — HEPATIC FUNCTION PANEL
ALT: 19 IU/L (ref 0–44)
AST: 14 IU/L (ref 0–40)
Albumin: 4.2 g/dL (ref 3.8–4.9)
Alkaline Phosphatase: 61 IU/L (ref 44–121)
Bilirubin Total: <0.2 mg/dL (ref 0.0–1.2)
Bilirubin, Direct: <0.1 mg/dL (ref 0.00–0.40)
Total Protein: 6.4 g/dL (ref 6.0–8.5)

## 2021-03-23 LAB — BASIC METABOLIC PANEL
BUN/Creatinine Ratio: 16 (ref 9–20)
BUN: 15 mg/dL (ref 6–24)
CO2: 23 mmol/L (ref 20–29)
Calcium: 9 mg/dL (ref 8.7–10.2)
Chloride: 99 mmol/L (ref 96–106)
Creatinine, Ser: 0.94 mg/dL (ref 0.76–1.27)
Glucose: 81 mg/dL (ref 65–99)
Potassium: 4.2 mmol/L (ref 3.5–5.2)
Sodium: 138 mmol/L (ref 134–144)
eGFR: 96 mL/min/{1.73_m2} (ref >59)

## 2021-03-23 LAB — TSH: TSH: 1.56 u[IU]/mL (ref 0.450–4.500)

## 2021-03-23 MED ORDER — ROSUVASTATIN CALCIUM 40 MG PO TABS: 20.0000 mg | ORAL_TABLET | Freq: Every day | ORAL | 3 refills | Status: AC

## 2021-03-23 NOTE — Addendum Note (Signed)
Addended by: Eleonore Chiquito on: 03/23/2021 04:47 PM   Modules accepted: Orders

## 2021-09-22 ENCOUNTER — Ambulatory Visit: Payer: 59 | Admitting: Cardiology

## 2021-09-30 ENCOUNTER — Other Ambulatory Visit: Payer: Self-pay | Admitting: Cardiology

## 2022-02-10 ENCOUNTER — Encounter: Payer: Self-pay | Admitting: Cardiology

## 2022-02-10 ENCOUNTER — Ambulatory Visit: Payer: BLUE CROSS/BLUE SHIELD | Admitting: Cardiology

## 2022-02-10 VITALS — BP 130/78 | HR 83 | Ht 67.0 in | Wt 267.0 lb

## 2022-02-10 DIAGNOSIS — E782 Mixed hyperlipidemia: Secondary | ICD-10-CM | POA: Diagnosis not present

## 2022-02-10 DIAGNOSIS — Z1321 Encounter for screening for nutritional disorder: Secondary | ICD-10-CM

## 2022-02-10 DIAGNOSIS — I2510 Atherosclerotic heart disease of native coronary artery without angina pectoris: Secondary | ICD-10-CM | POA: Diagnosis not present

## 2022-02-10 DIAGNOSIS — Z131 Encounter for screening for diabetes mellitus: Secondary | ICD-10-CM

## 2022-02-10 DIAGNOSIS — I10 Essential (primary) hypertension: Secondary | ICD-10-CM | POA: Diagnosis not present

## 2022-02-10 MED ORDER — LISINOPRIL 40 MG PO TABS: 40.0000 mg | ORAL_TABLET | Freq: Every day | ORAL | 3 refills | Status: DC

## 2022-02-10 MED ORDER — NITROGLYCERIN 0.4 MG SL SUBL: 0.4000 mg | SUBLINGUAL_TABLET | SUBLINGUAL | 11 refills | Status: DC | PRN

## 2022-02-10 MED ORDER — AMLODIPINE BESYLATE 10 MG PO TABS: 10.0000 mg | ORAL_TABLET | Freq: Every day | ORAL | 3 refills | Status: DC

## 2022-02-10 NOTE — Progress Notes (Signed)
Cardiology Office Note:    Date:  02/10/2022   ID:  BAILY SERPE, DOB Sep 19, 1965, MRN [id number]  PCP:  Noni Saupe, MD  Cardiologist:  Garwin Brothers, MD   Referring MD: Noni Saupe, MD    ASSESSMENT:    1. Coronary artery disease involving native coronary artery of native heart without angina pectoris   2. Essential hypertension   3. Mixed dyslipidemia   4. Morbid obesity (HCC)    PLAN:    In order of problems listed above:  Mild coronary artery disease: Secondary prevention stressed with the patient.  Importance of compliance with diet and medication stressed and vocalized understanding.  Questions were answered to satisfaction. Essential hypertension: Blood pressure stable and diet was emphasized.  Lifestyle modification urged. Mixed dyslipidemia: On lipid-lowering medications.  Has not had blood work for some time.  He tells me that he missed appointments in the past.  Insurance issues and job change I counseled him about this.  He will be back for complete blood work.  We will do hemoglobin A1c and he requests vitamin D level checked because of deficiency history. Morbid obesity: Weight reduction stressed diet emphasized and he promises to do better. Patient will be seen in follow-up appointment in 6 months or earlier if the patient has any concerns    Medication Adjustments/Labs and Tests Ordered: Current medicines are reviewed at length with the patient today.  Concerns regarding medicines are outlined above.  No orders of the defined types were placed in this encounter.  No orders of the defined types were placed in this encounter.    No chief complaint on file.    History of Present Illness:    Dale Carpenter is a 56 y.o. male with past medical history of nonobstructive coronary artery disease, essential hypertension, dyslipidemia and morbid obesity.  He denies any problems at this time and takes care of activities of daily living.   No chest pain orthopnea or PND.  He does not exercise on a regular basis.  Past Medical History:  Diagnosis Date   Abnormal nuclear stress test 03/14/2019   CAD (coronary artery disease) 04/09/2019   Chest discomfort 03/10/2019   Dyspnea on exertion 03/10/2019   Essential hypertension 03/10/2019   Hypertension    Mixed dyslipidemia 12/01/2019   Morbid obesity (HCC) 10/30/2019   Overweight 03/10/2019    Past Surgical History:  Procedure Laterality Date   HERNIA REPAIR     LEFT HEART CATH AND CORONARY ANGIOGRAPHY N/A 03/21/2019   Procedure: LEFT HEART CATH AND CORONARY ANGIOGRAPHY;  Surgeon: Corky Crafts, MD;  Location: [facility];  Service: Cardiovascular;  Laterality: N/A;    Current Medications: Current Meds  Medication Sig   amLODipine (NORVASC) 10 MG tablet Take 10 mg by mouth daily.   aspirin EC 81 MG tablet Take 81 mg by mouth daily.   chlorthalidone (HYGROTON) 25 MG tablet TAKE 1 TABLET(25 MG) BY MOUTH DAILY   cloNIDine (CATAPRES) 0.1 MG tablet Take 0.1 mg by mouth daily as needed for high blood pressure (Pressure over 160/100). Pressure over 160/100   fluticasone (FLONASE) 50 MCG/ACT nasal spray Place 2 sprays into both nostrils daily as needed for allergies.    lisinopril (ZESTRIL) 40 MG tablet Take 1 tablet (40 mg total) by mouth daily.   nebivolol (BYSTOLIC) 10 MG tablet Take 1 tablet (10 mg total) by mouth daily.   nitroGLYCERIN (NITROSTAT) 0.4 MG SL tablet Place 0.4 mg under  the tongue every 5 (five) minutes as needed for chest pain.    rosuvastatin (CRESTOR) 40 MG tablet Take 0.5 tablets (20 mg total) by mouth daily.   VENTOLIN HFA 108 (90 Base) MCG/ACT inhaler Inhale 2 puffs into the lungs every 6 (six) hours as needed for wheezing or shortness of breath.      Allergies:   Patient has no known allergies.   Social History   Socioeconomic History   Marital status: Married    Spouse name: Not on file   Number of children: Not on file   Years of education: Not  on file   Highest education level: Not on file  Occupational History   Not on file  Tobacco Use   Smoking status: Never   Smokeless tobacco: Current    Types: Chew  Substance and Sexual Activity   Alcohol use: Not on file   Drug use: Not on file   Sexual activity: Not on file  Other Topics Concern   Not on file  Social History Narrative   Not on file   Social Determinants of Health   Financial Resource Strain: Not on file  Food Insecurity: Not on file  Transportation Needs: Not on file  Physical Activity: Not on file  Stress: Not on file  Social Connections: Not on file     Family History: The patient's family history includes Fibromyalgia in his sister; GI problems in his sister; Heart Problems in his maternal grandfather; Heart attack in his paternal grandfather; Hypertension in his father and mother; Kidney disease in his father.  ROS:   Please see the history of present illness.    All other systems reviewed and are negative.  EKGs/Labs/Other Studies Reviewed:    The following studies were reviewed today: EKG reveals sinus rhythm and nonspecific ST-T changes   Recent Labs: 03/22/2021: ALT 19; BUN 15; Creatinine, Ser 0.94; Hemoglobin 15.2; Platelets 285; Potassium 4.2; Sodium 138; TSH 1.560  Recent Lipid Panel    Component Value Date/Time   CHOL 188 03/22/2021 0819   TRIG 57 03/22/2021 0819   HDL 44 03/22/2021 0819   CHOLHDL 4.3 03/22/2021 0819   LDLCALC 133 (H) 03/22/2021 0819    Physical Exam:    VS:  BP 130/78   Pulse 83   Ht 5\' 7"  (1.702 m)   Wt 267 lb (121.1 kg)   SpO2 98%   BMI 41.82 kg/m     Wt Readings from Last 3 Encounters:  02/10/22 267 lb (121.1 kg)  03/18/21 268 lb 9.6 oz (121.8 kg)  09/10/20 264 lb (119.7 kg)     GEN: Patient is in no acute distress HEENT: Normal NECK: No JVD; No carotid bruits LYMPHATICS: No lymphadenopathy CARDIAC: Hear sounds regular, 2/6 systolic murmur at the apex. RESPIRATORY:  Clear to auscultation  without rales, wheezing or rhonchi  ABDOMEN: Soft, non-tender, non-distended MUSCULOSKELETAL:  No edema; No deformity  SKIN: Warm and dry NEUROLOGIC:  Alert and oriented x 3 PSYCHIATRIC:  Normal affect   Signed, 11/08/20, MD  02/10/2022 2:50 PM    [facility]

## 2022-02-10 NOTE — Patient Instructions (Signed)
Medication Instructions:  Your physician recommends that you continue on your current medications as directed. Please refer to the Current Medication list given to you today.  *If you need a refill on your cardiac medications before your next appointment, please call your pharmacy*   Lab Work: Your physician recommends that you return for lab work in: the next few days. You need to have labs done when you are fasting.  You can come Monday through Friday 8:30 am to 12:00 pm and 1:15 to 4:30. You do not need to make an appointment as the order has already been placed. The labs you are going to have done are BMET, CBC, TSH, vitamin D, A1C, LFT and Lipids.  If you have labs (blood work) drawn today and your tests are completely normal, you will receive your results only by: MyChart Message (if you have MyChart) OR A paper copy in the mail If you have any lab test that is abnormal or we need to change your treatment, we will call you to review the results.   Testing/Procedures: None ordered   Follow-Up: At [facility], you and your health needs are our priority.  As part of our continuing mission to provide you with exceptional heart care, we have created designated Provider Care Teams.  These Care Teams include your primary Cardiologist (physician) and Advanced Practice Providers (APPs -  Physician Assistants and Nurse Practitioners) who all work together to provide you with the care you need, when you need it.  We recommend signing up for the patient portal called "MyChart".  Sign up information is provided on this After Visit Summary.  MyChart is used to connect with patients for Virtual Visits (Telemedicine).  Patients are able to view lab/test results, encounter notes, upcoming appointments, etc.  Non-urgent messages can be sent to your provider as well.   To learn more about what you can do with MyChart, go to [email or web address].    Your next appointment:   9 month(s)  The format  for your next appointment:   In Person  Provider:   Rajan Revankar, MD   Other Instructions NA  

## 2022-02-18 LAB — CBC
Hematocrit: 45.8 % (ref 37.5–51.0)
Hemoglobin: 15.6 g/dL (ref 13.0–17.7)
MCH: 31.5 pg (ref 26.6–33.0)
MCHC: 34.1 g/dL (ref 31.5–35.7)
MCV: 92 fL (ref 79–97)
Platelets: 278 10*3/uL (ref 150–450)
RBC: 4.96 x10E6/uL (ref 4.14–5.80)
RDW: 12.6 % (ref 11.6–15.4)
WBC: 7.3 10*3/uL (ref 3.4–10.8)

## 2022-02-18 LAB — HEPATIC FUNCTION PANEL
ALT: 22 IU/L (ref 0–44)
AST: 17 IU/L (ref 0–40)
Albumin: 4.4 g/dL (ref 3.8–4.9)
Alkaline Phosphatase: 65 IU/L (ref 44–121)
Bilirubin Total: 0.5 mg/dL (ref 0.0–1.2)
Bilirubin, Direct: 0.15 mg/dL (ref 0.00–0.40)
Total Protein: 6.5 g/dL (ref 6.0–8.5)

## 2022-02-18 LAB — HEMOGLOBIN A1C
Est. average glucose Bld gHb Est-mCnc: 114 mg/dL
Hgb A1c MFr Bld: 5.6 % (ref 4.8–5.6)

## 2022-02-18 LAB — VITAMIN D 25 HYDROXY (VIT D DEFICIENCY, FRACTURES): Vit D, 25-Hydroxy: 13.3 ng/mL — ABNORMAL LOW (ref 30.0–100.0)

## 2022-02-18 LAB — BASIC METABOLIC PANEL
BUN/Creatinine Ratio: 18 (ref 9–20)
BUN: 18 mg/dL (ref 6–24)
CO2: 25 mmol/L (ref 20–29)
Calcium: 9.3 mg/dL (ref 8.7–10.2)
Chloride: 98 mmol/L (ref 96–106)
Creatinine, Ser: 1 mg/dL (ref 0.76–1.27)
Glucose: 92 mg/dL (ref 70–99)
Potassium: 4.2 mmol/L (ref 3.5–5.2)
Sodium: 135 mmol/L (ref 134–144)
eGFR: 88 mL/min/{1.73_m2} (ref >59)

## 2022-02-18 LAB — LIPID PANEL
Chol/HDL Ratio: 5 ratio (ref 0.0–5.0)
Cholesterol, Total: 174 mg/dL (ref 100–199)
HDL: 35 mg/dL — ABNORMAL LOW (ref >39)
LDL Chol Calc (NIH): 122 mg/dL — ABNORMAL HIGH (ref 0–99)
Triglycerides: 93 mg/dL (ref 0–149)
VLDL Cholesterol Cal: 17 mg/dL (ref 5–40)

## 2022-02-18 LAB — TSH: TSH: 2.64 u[IU]/mL (ref 0.450–4.500)

## 2022-02-21 DIAGNOSIS — E559 Vitamin D deficiency, unspecified: Secondary | ICD-10-CM

## 2022-02-21 HISTORY — DX: Vitamin D deficiency, unspecified: E55.9

## 2022-02-21 NOTE — Addendum Note (Signed)
Addended by: Eleonore Chiquito on: 02/21/2022 08:17 AM   Modules accepted: Orders

## 2022-12-22 ENCOUNTER — Encounter: Payer: Self-pay | Admitting: Cardiology

## 2022-12-22 ENCOUNTER — Ambulatory Visit: Payer: 59 | Attending: Cardiology | Admitting: Cardiology

## 2022-12-22 VITALS — BP 116/78 | HR 84 | Ht 67.0 in | Wt 265.4 lb

## 2022-12-22 DIAGNOSIS — I10 Essential (primary) hypertension: Secondary | ICD-10-CM

## 2022-12-22 DIAGNOSIS — Z131 Encounter for screening for diabetes mellitus: Secondary | ICD-10-CM

## 2022-12-22 DIAGNOSIS — E559 Vitamin D deficiency, unspecified: Secondary | ICD-10-CM

## 2022-12-22 DIAGNOSIS — E782 Mixed hyperlipidemia: Secondary | ICD-10-CM | POA: Diagnosis not present

## 2022-12-22 DIAGNOSIS — I2510 Atherosclerotic heart disease of native coronary artery without angina pectoris: Secondary | ICD-10-CM | POA: Diagnosis not present

## 2022-12-22 MED ORDER — NITROGLYCERIN 0.4 MG SL SUBL: 0.4000 mg | SUBLINGUAL_TABLET | SUBLINGUAL | 11 refills | Status: AC | PRN

## 2022-12-22 NOTE — Progress Notes (Signed)
` Cardiology Office Note:    Date:  12/22/2022   ID:  Dale Carpenter, DOB 1966-01-28, MRN [id number]  PCP:  Noni Saupe, MD  Cardiologist:  Garwin Brothers, MD   Referring MD: Noni Saupe, MD    ASSESSMENT:    1. Coronary artery disease involving native coronary artery of native heart without angina pectoris   2. Essential hypertension   3. Morbid obesity (HCC)   4. Mixed dyslipidemia   5. Vitamin D deficiency    PLAN:    In order of problems listed above:  Mild coronary artery disease: Secondary prevention stressed with the patient.  Importance of compliance with diet medication stressed and he vocalized understanding.  He was advised to walk at least half an hour a day 5 days a week and he promises to do so. Essential hypertension: Blood pressure stable and diet was emphasized. Mixed dyslipidemia and morbid obesity: Weight reduction stressed diet emphasized.  Lipids are not at goal.  LDL must be less than 60.  He will have complete blood work today as he is fasting.  I explained the risks of uncontrolled risk factors. Vitamin D deficiency: Will get blood work done today.  This is followed by primary care. Patient will be seen in follow-up appointment in 12 months or earlier if the patient has any concerns.    Medication Adjustments/Labs and Tests Ordered: Current medicines are reviewed at length with the patient today.  Concerns regarding medicines are outlined above.  No orders of the defined types were placed in this encounter.  No orders of the defined types were placed in this encounter.    No chief complaint on file.    History of Present Illness:    Dale Carpenter is a 57 y.o. male.  Patient has past medical history of mild nonobstructive coronary artery disease, essential hypertension, mixed dyslipidemia, morbid obesity.  She denies any problems at this time and takes care of activities of daily living.  No chest pain orthopnea or PND.   He has history of vitamin D deficiency.  At the time of my evaluation, the patient is alert awake oriented and in no distress.  Past Medical History:  Diagnosis Date   Abnormal nuclear stress test 03/14/2019   CAD (coronary artery disease) 04/09/2019   Chest discomfort 03/10/2019   Dyspnea on exertion 03/10/2019   Essential hypertension 03/10/2019   Hypertension    Mixed dyslipidemia 12/01/2019   Morbid obesity (HCC) 10/30/2019   Overweight 03/10/2019   Vitamin D deficiency 02/21/2022    Past Surgical History:  Procedure Laterality Date   HERNIA REPAIR     LEFT HEART CATH AND CORONARY ANGIOGRAPHY N/A 03/21/2019   Procedure: LEFT HEART CATH AND CORONARY ANGIOGRAPHY;  Surgeon: Corky Crafts, MD;  Location: [facility];  Service: Cardiovascular;  Laterality: N/A;    Current Medications: Current Meds  Medication Sig   amLODipine (NORVASC) 10 MG tablet Take 1 tablet (10 mg total) by mouth daily.   aspirin EC 81 MG tablet Take 81 mg by mouth daily.   chlorthalidone (HYGROTON) 25 MG tablet TAKE 1 TABLET(25 MG) BY MOUTH DAILY   cloNIDine (CATAPRES) 0.1 MG tablet Take 0.1 mg by mouth daily as needed for high blood pressure (Pressure over 160/100). Pressure over 160/100   fluticasone (FLONASE) 50 MCG/ACT nasal spray Place 2 sprays into both nostrils daily as needed for allergies.    lisinopril (ZESTRIL) 40 MG tablet Take 1 tablet (40  mg total) by mouth daily.   nitroGLYCERIN (NITROSTAT) 0.4 MG SL tablet Place 1 tablet (0.4 mg total) under the tongue every 5 (five) minutes as needed for chest pain.   rosuvastatin (CRESTOR) 40 MG tablet Take 0.5 tablets (20 mg total) by mouth daily.   VENTOLIN HFA 108 (90 Base) MCG/ACT inhaler Inhale 2 puffs into the lungs every 6 (six) hours as needed for wheezing or shortness of breath.      Allergies:   Patient has no known allergies.   Social History   Socioeconomic History   Marital status: Married    Spouse name: Not on file    Number of children: Not on file   Years of education: Not on file   Highest education level: Not on file  Occupational History   Not on file  Tobacco Use   Smoking status: Never   Smokeless tobacco: Current    Types: Chew  Substance and Sexual Activity   Alcohol use: Not on file   Drug use: Not on file   Sexual activity: Not on file  Other Topics Concern   Not on file  Social History Narrative   Not on file   Social Determinants of Health   Financial Resource Strain: Not on file  Food Insecurity: Not on file  Transportation Needs: Not on file  Physical Activity: Not on file  Stress: Not on file  Social Connections: Not on file     Family History: The patient's family history includes Fibromyalgia in his sister; GI problems in his sister; Heart Problems in his maternal grandfather; Heart attack in his paternal grandfather; Hypertension in his father and mother; Kidney disease in his father.  ROS:   Please see the history of present illness.    All other systems reviewed and are negative.  EKGs/Labs/Other Studies Reviewed:    The following studies were reviewed today: I discussed my findings with the patient at length.   Recent Labs: 02/17/2022: ALT 22; BUN 18; Creatinine, Ser 1.00; Hemoglobin 15.6; Platelets 278; Potassium 4.2; Sodium 135; TSH 2.640  Recent Lipid Panel    Component Value Date/Time   CHOL 174 02/17/2022 0853   TRIG 93 02/17/2022 0853   HDL 35 (L) 02/17/2022 0853   CHOLHDL 5.0 02/17/2022 0853   LDLCALC 122 (H) 02/17/2022 0853    Physical Exam:    VS:  BP 116/78   Pulse 84   Ht 5\' 7"  (1.702 m)   Wt 265 lb 6.4 oz (120.4 kg)   SpO2 98%   BMI 41.57 kg/m     Wt Readings from Last 3 Encounters:  12/22/22 265 lb 6.4 oz (120.4 kg)  02/10/22 267 lb (121.1 kg)  03/18/21 268 lb 9.6 oz (121.8 kg)     GEN: Patient is in no acute distress HEENT: Normal NECK: No JVD; No carotid bruits LYMPHATICS: No lymphadenopathy CARDIAC: Hear sounds regular,  2/6 systolic murmur at the apex. RESPIRATORY:  Clear to auscultation without rales, wheezing or rhonchi  ABDOMEN: Soft, non-tender, non-distended MUSCULOSKELETAL:  No edema; No deformity  SKIN: Warm and dry NEUROLOGIC:  Alert and oriented x 3 PSYCHIATRIC:  Normal affect   Signed, Garwin Brothers, MD  12/22/2022 9:08 AM    [facility]

## 2022-12-22 NOTE — Patient Instructions (Addendum)
Medication Instructions:  Your physician recommends that you continue on your current medications as directed. Please refer to the Current Medication list given to you today.  *If you need a refill on your cardiac medications before your next appointment, please call your pharmacy*   Lab Work: Your physician recommends that you have labs done in the office today. Your test included CMP, complete blood count, TSH, vitamin D, A1C and lipids.  If you have labs (blood work) drawn today and your tests are completely normal, you will receive your results only by: MyChart Message (if you have MyChart) OR A paper copy in the mail If you have any lab test that is abnormal or we need to change your treatment, we will call you to review the results.   Testing/Procedures: None ordered   Follow-Up: At [facility], you and your health needs are our priority.  As part of our continuing mission to provide you with exceptional heart care, we have created designated Provider Care Teams.  These Care Teams include your primary Cardiologist (physician) and Advanced Practice Providers (APPs -  Physician Assistants and Nurse Practitioners) who all work together to provide you with the care you need, when you need it.  We recommend signing up for the patient portal called "MyChart".  Sign up information is provided on this After Visit Summary.  MyChart is used to connect with patients for Virtual Visits (Telemedicine).  Patients are able to view lab/test results, encounter notes, upcoming appointments, etc.  Non-urgent messages can be sent to your provider as well.   To learn more about what you can do with MyChart, go to [email or web address].    Your next appointment:   12 month(s)  The format for your next appointment:   In Person  Provider:   Belva Crome, MD    Other Instructions none  Important Information About Sugar

## 2022-12-23 LAB — CBC
Hematocrit: 49.1 % (ref 37.5–51.0)
Hemoglobin: 16.4 g/dL (ref 13.0–17.7)
MCH: 30.9 pg (ref 26.6–33.0)
MCHC: 33.4 g/dL (ref 31.5–35.7)
MCV: 93 fL (ref 79–97)
Platelets: 292 10*3/uL (ref 150–450)
RBC: 5.3 x10E6/uL (ref 4.14–5.80)
RDW: 12.4 % (ref 11.6–15.4)
WBC: 7.2 10*3/uL (ref 3.4–10.8)

## 2022-12-23 LAB — COMPREHENSIVE METABOLIC PANEL
ALT: 23 IU/L (ref 0–44)
AST: 20 IU/L (ref 0–40)
Albumin/Globulin Ratio: 2.2 (ref 1.2–2.2)
Albumin: 4.7 g/dL (ref 3.8–4.9)
Alkaline Phosphatase: 71 IU/L (ref 44–121)
BUN/Creatinine Ratio: 14 (ref 9–20)
BUN: 14 mg/dL (ref 6–24)
Bilirubin Total: 0.3 mg/dL (ref 0.0–1.2)
CO2: 22 mmol/L (ref 20–29)
Calcium: 9.6 mg/dL (ref 8.7–10.2)
Chloride: 97 mmol/L (ref 96–106)
Creatinine, Ser: 1 mg/dL (ref 0.76–1.27)
Globulin, Total: 2.1 g/dL (ref 1.5–4.5)
Glucose: 92 mg/dL (ref 70–99)
Potassium: 4.1 mmol/L (ref 3.5–5.2)
Sodium: 135 mmol/L (ref 134–144)
Total Protein: 6.8 g/dL (ref 6.0–8.5)
eGFR: 88 mL/min/{1.73_m2} (ref >59)

## 2022-12-23 LAB — LIPID PANEL
Chol/HDL Ratio: 4.3 ratio (ref 0.0–5.0)
Cholesterol, Total: 183 mg/dL (ref 100–199)
HDL: 43 mg/dL (ref >39)
LDL Chol Calc (NIH): 120 mg/dL — ABNORMAL HIGH (ref 0–99)
Triglycerides: 110 mg/dL (ref 0–149)
VLDL Cholesterol Cal: 20 mg/dL (ref 5–40)

## 2022-12-23 LAB — HEMOGLOBIN A1C
Est. average glucose Bld gHb Est-mCnc: 114 mg/dL
Hgb A1c MFr Bld: 5.6 % (ref 4.8–5.6)

## 2022-12-23 LAB — TSH: TSH: 2.06 u[IU]/mL (ref 0.450–4.500)

## 2022-12-23 LAB — VITAMIN D 25 HYDROXY (VIT D DEFICIENCY, FRACTURES): Vit D, 25-Hydroxy: 10.9 ng/mL — ABNORMAL LOW (ref 30.0–100.0)

## 2022-12-26 ENCOUNTER — Telehealth: Payer: Self-pay

## 2022-12-26 DIAGNOSIS — E782 Mixed hyperlipidemia: Secondary | ICD-10-CM

## 2022-12-26 MED ORDER — EZETIMIBE 10 MG PO TABS: 10.0000 mg | ORAL_TABLET | Freq: Every day | ORAL | 3 refills | Status: AC

## 2022-12-26 NOTE — Telephone Encounter (Signed)
-----   Message from Garwin Brothers, MD sent at 12/25/2022  9:39 AM EDT ----- Add zetia and det and LL in 6wks cc pcp Garwin Brothers, MD 12/25/2022 9:39 AM

## 2023-01-23 ENCOUNTER — Other Ambulatory Visit: Payer: Self-pay | Admitting: Cardiology

## 2023-01-23 DIAGNOSIS — I10 Essential (primary) hypertension: Secondary | ICD-10-CM

## 2023-04-18 ENCOUNTER — Other Ambulatory Visit: Payer: Self-pay | Admitting: Cardiology

## 2023-04-18 DIAGNOSIS — I10 Essential (primary) hypertension: Secondary | ICD-10-CM

## 2024-01-15 ENCOUNTER — Other Ambulatory Visit: Payer: Self-pay | Admitting: Cardiology

## 2024-01-15 DIAGNOSIS — I10 Essential (primary) hypertension: Secondary | ICD-10-CM
